# Patient Record
Sex: Male | Born: 2000 | Race: Black or African American | Marital: Single | Smoking: Never smoker
Health system: Southern US, Community
[De-identification: ages and names within clinical notes are randomized; demographics above are authoritative.]

## PROBLEM LIST (undated history)

## (undated) DIAGNOSIS — J45909 Unspecified asthma, uncomplicated: Secondary | ICD-10-CM

## (undated) DIAGNOSIS — F0781 Postconcussional syndrome: Secondary | ICD-10-CM

## (undated) DIAGNOSIS — F419 Anxiety disorder, unspecified: Secondary | ICD-10-CM

## (undated) DIAGNOSIS — R9431 Abnormal electrocardiogram [ECG] [EKG]: Secondary | ICD-10-CM

## (undated) DIAGNOSIS — J309 Allergic rhinitis, unspecified: Secondary | ICD-10-CM

## (undated) HISTORY — PX: NO PAST SURGERIES: SHX2092

## (undated) HISTORY — DX: Postconcussional syndrome: F07.81

## (undated) HISTORY — DX: Abnormal electrocardiogram (ECG) (EKG): R94.31

## (undated) HISTORY — DX: Allergic rhinitis, unspecified: J30.9

## (undated) HISTORY — DX: Unspecified asthma, uncomplicated: J45.909

---

## 2019-05-25 ENCOUNTER — Ambulatory Visit: Payer: PRIVATE HEALTH INSURANCE | Attending: Internal Medicine

## 2019-05-25 DIAGNOSIS — Z20828 Contact with and (suspected) exposure to other viral communicable diseases: Secondary | ICD-10-CM | POA: Insufficient documentation

## 2019-05-25 DIAGNOSIS — Z20822 Contact with and (suspected) exposure to covid-19: Secondary | ICD-10-CM

## 2019-05-27 LAB — NOVEL CORONAVIRUS, NAA: SARS-CoV-2, NAA: NOT DETECTED

## 2019-06-01 ENCOUNTER — Ambulatory Visit: Payer: PRIVATE HEALTH INSURANCE | Attending: Internal Medicine

## 2019-06-01 DIAGNOSIS — Z20822 Contact with and (suspected) exposure to covid-19: Secondary | ICD-10-CM | POA: Insufficient documentation

## 2019-06-02 LAB — NOVEL CORONAVIRUS, NAA: SARS-CoV-2, NAA: NOT DETECTED

## 2019-06-10 ENCOUNTER — Ambulatory Visit: Payer: PRIVATE HEALTH INSURANCE | Attending: Internal Medicine

## 2019-06-10 DIAGNOSIS — Z20822 Contact with and (suspected) exposure to covid-19: Secondary | ICD-10-CM | POA: Insufficient documentation

## 2019-06-12 LAB — NOVEL CORONAVIRUS, NAA: SARS-CoV-2, NAA: NOT DETECTED

## 2019-08-03 ENCOUNTER — Ambulatory Visit: Payer: PRIVATE HEALTH INSURANCE | Attending: Internal Medicine

## 2019-08-03 DIAGNOSIS — Z20822 Contact with and (suspected) exposure to covid-19: Secondary | ICD-10-CM | POA: Insufficient documentation

## 2019-08-04 LAB — NOVEL CORONAVIRUS, NAA: SARS-CoV-2, NAA: NOT DETECTED

## 2019-11-27 ENCOUNTER — Emergency Department (HOSPITAL_COMMUNITY): Payer: PRIVATE HEALTH INSURANCE

## 2019-11-27 ENCOUNTER — Encounter (HOSPITAL_COMMUNITY): Payer: Self-pay

## 2019-11-27 ENCOUNTER — Emergency Department (HOSPITAL_COMMUNITY)
Admission: EM | Admit: 2019-11-27 | Discharge: 2019-11-27 | Disposition: A | Discharge status: Home / Self Care | Payer: PRIVATE HEALTH INSURANCE | Attending: Emergency Medicine | Admitting: Emergency Medicine

## 2019-11-27 ENCOUNTER — Other Ambulatory Visit: Payer: Self-pay

## 2019-11-27 DIAGNOSIS — Y998 Other external cause status: Secondary | ICD-10-CM | POA: Insufficient documentation

## 2019-11-27 DIAGNOSIS — Y92310 Basketball court as the place of occurrence of the external cause: Secondary | ICD-10-CM | POA: Insufficient documentation

## 2019-11-27 DIAGNOSIS — Y9364 Activity, baseball: Secondary | ICD-10-CM | POA: Insufficient documentation

## 2019-11-27 DIAGNOSIS — S0990XA Unspecified injury of head, initial encounter: Secondary | ICD-10-CM | POA: Diagnosis present

## 2019-11-27 DIAGNOSIS — Y30XXXA Falling, jumping or pushed from a high place, undetermined intent, initial encounter: Secondary | ICD-10-CM | POA: Insufficient documentation

## 2019-11-27 DIAGNOSIS — S060X0A Concussion without loss of consciousness, initial encounter: Secondary | ICD-10-CM | POA: Insufficient documentation

## 2019-11-27 NOTE — Discharge Instructions (Addendum)
Your symptoms are unclear, but could be a concussion from the head injury in February 2021.  The CAT scan today is normal.  Call the doctor listed, for appointment to be seen for further evaluation and treatment of a possible concussion.

## 2019-11-27 NOTE — ED Provider Notes (Signed)
[facility]-EMERGENCY DEPT Provider Note   CSN: [id number] Arrival date & time: 11/27/19  2041     History Chief Complaint  Patient presents with  . Head Injury    Dave Munoz is a 19 y.o. male.  HPI He presents for evaluation of intermittent sensation of lightheadedness with dizziness, inattention to detail, and difficulty completing his job duties.  Onset of symptoms after a head injury while playing basketball in February 2021.  He had to stay out of basketball playing for 5 weeks, because of the symptoms but they improved somewhat so he returned to basketball.  He denies blurred vision, double vision, altered taste, sensation or smell.  He does not have neck or back pain.  He is not have ongoing headaches.  He has not had this problem before.  There are no other known modifying factors.  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      History reviewed. No pertinent past medical history.  There are no problems to display for this patient.       History reviewed. No pertinent family history.  Social History   Tobacco Use  . Smoking status: Not on file  Substance Use Topics  . Alcohol use: Not on file  . Drug use: Not on file    Home Medications Prior to Admission medications   Medication Sig Start Date End Date Taking? Authorizing Provider  albuterol (VENTOLIN HFA) 108 (90 Base) MCG/ACT inhaler albuterol sulfate HFA 90 mcg/actuation aerosol inhaler  INL 2 PFS PO Q 6 H PRN    [provider]    Allergies    Ibuprofen  Review of Systems   Review of Systems  All other systems reviewed and are negative.   Physical Exam Updated Vital Signs BP 128/74 (BP Location: Left Arm)   Pulse 75   Temp 98.4 F (36.9 C) (Oral)   Resp 16   Ht 6\' 2"  (1.88 m)   Wt 78 kg   SpO2 99%   BMI 22.08 kg/m   Physical Exam Vitals and nursing note reviewed.  Constitutional:      General:  He is not in acute distress.    Appearance: He is well-developed. He is not ill-appearing, toxic-appearing or diaphoretic.  HENT:     Head: Normocephalic and atraumatic.     Right Ear: External ear normal.     Left Ear: External ear normal.  Eyes:     Conjunctiva/sclera: Conjunctivae normal.     Pupils: Pupils are equal, round, and reactive to light.  Neck:     Trachea: Phonation normal.  Cardiovascular:     Rate and Rhythm: Normal rate and regular rhythm.     Heart sounds: Normal heart sounds.  Pulmonary:     Effort: Pulmonary effort is normal.     Breath sounds: Normal breath sounds.  Abdominal:     General: There is no distension.  Musculoskeletal:        General: Normal range of motion.     Cervical back: Normal range of motion and neck supple.     Comments: Normal gait  Skin:    General: Skin is warm and dry.  Neurological:     Mental Status: He is alert and oriented to person, place, and time.     Cranial Nerves: No cranial nerve deficit.     Sensory: No sensory deficit.     Motor: No abnormal muscle  tone.     Coordination: Coordination normal.     Comments: No dysarthria, aphasia or nystagmus, ataxia, pronator drift, abnormal Romberg.  Psychiatric:        Mood and Affect: Mood normal.        Behavior: Behavior normal.        Thought Content: Thought content normal.        Judgment: Judgment normal.     ED Results / Procedures / Treatments   Labs (all labs ordered are listed, but only abnormal results are displayed) Labs Reviewed - No data to display  EKG None  Radiology CT Head Wo Contrast  Result Date: 11/27/2019 CLINICAL DATA:  Dizziness EXAM: CT HEAD WITHOUT CONTRAST TECHNIQUE: Contiguous axial images were obtained from the base of the skull through the vertex without intravenous contrast. COMPARISON:  None. FINDINGS: Brain: No evidence of acute infarction, hemorrhage, hydrocephalus, extra-axial collection or mass lesion/mass effect. Vascular: No hyperdense  vessel or unexpected calcification. Skull: Normal. Negative for fracture or focal lesion. Sinuses/Orbits: No acute finding. Other: None IMPRESSION: Negative non contrasted CT appearance of the brain Electronically Signed   By: Jasmine Pang M.D.   On: 11/27/2019 23:04    Procedures Procedures (including critical care time)  Medications Ordered in ED Medications - No data to display  ED Course  I have reviewed the triage vital signs and the nursing notes.  Pertinent labs & imaging results that were available during my care of the patient were reviewed by me and considered in my medical decision making (see chart for details).  Clinical Course as of Nov 27 2319  Fri Nov 27, 2019  2321 Per radiologist, no acute abnormalities.  CT Head Wo Contrast [EW]    Clinical Course User Index [EW] Mancel Bale, MD   MDM Rules/Calculators/A&P                           Patient Vitals for the past 24 hrs:  BP Temp Temp src Pulse Resp SpO2 Height Weight  11/27/19 2123 128/74 -- -- 75 16 99 % -- --  11/27/19 2054 132/76 98.4 F (36.9 C) Oral 68 16 100 % 6\' 2"  (1.88 m) 78 kg    At the time of discharge- reevaluation with update and discussion. After initial assessment and treatment, an updated evaluation reveals he is comfortable and has no current complaints. Findings discussed and questions answered.   Medical Decision Making:  This patient is presenting for evaluation of vague symptoms since head injury several months ago, which does require a range of treatment options, and is a complaint that involves a moderate risk of morbidity and mortality. The differential diagnoses include anxiety, concussion, malaise. I decided to review old records, and in summary healthy young male with ongoing symptoms for many months after a fall head injury during a basketball game..  I did not require additional historical information from anyone.   Radiologic Tests Ordered, included CT head.   I independently Visualized: Radiographic images, which show normal findings    Critical Interventions-clinical evaluation, CT imaging, observation reassessment  After These Interventions, the Patient was reevaluated and was found patient may have mild concussion symptoms, bu there is no acute abnormality found on clinical or imaging evaluations  CRITICAL CARE-no Performed by: Mancel Bale  Nursing Notes Reviewed/ Care Coordinated Applicable Imaging Reviewed Interpretation of Laboratory Data incorporated into ED treatment  The patient appears reasonably screened and/or stabilized for discharge and I  doubt any other medical condition or other [facility] requiring further screening, evaluation, or treatment in the ED at this time prior to discharge.  Plan: Home Medications-OTC analgesia of choice if needed; Home Treatments-rest, fluids; return here if the recommended treatment, does not improve the symptoms; Recommended follow up-follow-up with sports medicine concussion specialist     Final Clinical Impression(s) / ED Diagnoses Final diagnoses:  Concussion without loss of consciousness, initial encounter    Rx / DC Orders ED Discharge Orders    None       Mancel Bale, MD 11/28/19 1729

## 2019-11-27 NOTE — ED Notes (Signed)
Patient ambulatory to CT

## 2019-11-27 NOTE — ED Triage Notes (Signed)
Pt states that he was dx'd with a concussion in February. States that he has not been the same since. Reports increased anxiety, intermittent dizziness and "fogginess." Denies nausea or pain at this time. Would like some head imaging. A&Ox4.

## 2019-12-01 ENCOUNTER — Telehealth: Payer: Self-pay | Admitting: Family Medicine

## 2019-12-01 NOTE — Telephone Encounter (Signed)
Called and scheduled pt for 12/02/19 at 3:45 pm.

## 2019-12-01 NOTE — Telephone Encounter (Signed)
Patient called requesting to schedule a Concussion visit.  He was seen in the ED on 11/27/2019: He presents for evaluation of intermittent sensation of lightheadedness with dizziness, inattention to detail, and difficulty completing his job duties.  Onset of symptoms after a head injury while playing basketball in February 2021.  He had to stay out of basketball playing for 5 weeks, because of the symptoms but they improved somewhat so he returned to basketball.  He denies blurred vision, double vision, altered taste, sensation or smell.  He does not have neck or back pain.  He is not have ongoing headaches.  He has not had this problem before.  There are no other known modifying factors.  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    Call back# [phone]

## 2019-12-02 ENCOUNTER — Ambulatory Visit (INDEPENDENT_AMBULATORY_CARE_PROVIDER_SITE_OTHER): Payer: PRIVATE HEALTH INSURANCE | Admitting: Family Medicine

## 2019-12-02 ENCOUNTER — Other Ambulatory Visit: Payer: Self-pay

## 2019-12-02 VITALS — BP 120/72 | HR 87 | Ht 74.0 in | Wt 169.2 lb

## 2019-12-02 DIAGNOSIS — F411 Generalized anxiety disorder: Secondary | ICD-10-CM

## 2019-12-02 DIAGNOSIS — F0781 Postconcussional syndrome: Secondary | ICD-10-CM | POA: Diagnosis not present

## 2019-12-02 DIAGNOSIS — J301 Allergic rhinitis due to pollen: Secondary | ICD-10-CM | POA: Insufficient documentation

## 2019-12-02 DIAGNOSIS — J45909 Unspecified asthma, uncomplicated: Secondary | ICD-10-CM | POA: Insufficient documentation

## 2019-12-02 MED ORDER — FLUOXETINE HCL 10 MG PO CAPS: ORAL_CAPSULE | ORAL | 0 refills | Status: DC

## 2019-12-02 NOTE — Progress Notes (Signed)
Subjective:    Chief Complaint: Dave Munoz, LAT, ATC, am serving as scribe for Dr. Clementeen Graham.  Dave Munoz,  is a 19 y.o. male who presents for evaluation of a head injury he sustained while playing basketball in Feb. 2021.  He had to remain out of basketball for 5 weeks after his initial injury but was able to return to full activity.  More recently, he recalls "knocking heads" w/ another player while playing pick-up basketball in June and now has c/o lightheadedness, dizziness, inattention to detail and difficulty completing his job duties.  No personal or family history of anxiety or depression or ADHD.  Injury date : Feb 2021 and re-injury in June 2021 Visit #: 1   History of Present Illness:    Concussion Self-Reported Symptom Score Symptoms rated on a scale 1-6, in last 24 hours   Headache: 3    Nausea: 2  Dizziness: 2  Vomiting: 0  Balance Difficulty: 2   Trouble Falling Asleep: 2   Fatigue: 3  Sleep Less Than Usual: 3  Daytime Drowsiness: 2  Sleep More Than Usual: 0  Photophobia: 2  Phonophobia: 2  Irritability: 2  Sadness: 1  Numbness or Tingling: 0  Nervousness: 4  Feeling More Emotional: 2  Feeling Mentally Foggy: 2  Feeling Slowed Down: 2  Memory Problems: 1  Difficulty Concentrating: 3  Visual Problems: 2   Total # of Symptoms: 19/22 Total Symptom Score: 52/132 Previous Symptom Score: NA   Neck Pain: Yes/No  Tinnitus: Yes/No  Review of Systems: No fevers or chills   Review of History: No prior history of concussion.  No personal history anxiety or depression or ADHD.  Personal history of asthma  Objective:    Physical Examination Vitals:   12/02/19 1530  BP: 120/72  Pulse: 87  SpO2: 97%   MSK: Normal cervical motion. Neuro: Normal coordination balance and gait Psych: Alert and oriented normal speech thought process and affect.   Assessment and Plan   19 y.o. male with postconcussion syndrome.  Concussion originally was in  February.  Symptoms today are predominantly mood related.  Fundamentally his symptoms act like anxiety.  We will treat as such.  Discussed options.  Plan to refer for counseling and will start SSRI.  We will start Prozac at 10 mg titrating to 20 mg in 1 week and follow-up in 2 weeks.  Patient agrees with plan.  Recheck back or contact me sooner if needed.  We will send copy of today's note to primary care provider.      Action/Discussion: Reviewed diagnosis, management options, expected outcomes, and the reasons for scheduled and emergent follow-up. Questions were adequately answered. Patient expressed verbal understanding and agreement with the following plan.     Patient Education:  Reviewed with patient the risks (i.e, a repeat concussion, post-concussion syndrome, second-impact syndrome) of returning to play prior to complete resolution, and thoroughly reviewed the signs and symptoms of concussion.Reviewed need for complete resolution of all symptoms, with rest AND exertion, prior to return to play.  Reviewed red flags for urgent medical evaluation: worsening symptoms, nausea/vomiting, intractable headache, musculoskeletal changes, focal neurological deficits.  [facility]'s Concussion Care Plan, which clearly outlines the plans stated above, was given to patient.   In addition to the time spent performing tests, I spent 30 min   Reviewed with patient the risks (i.e, a repeat concussion, post-concussion syndrome, second-impact syndrome) of returning to play prior to complete resolution, and thoroughly reviewed the  signs and symptoms of      concussion. Reviewedf need for complete resolution of all symptoms, with rest AND exertion, prior to return to play.  Reviewed red flags for urgent medical evaluation: worsening symptoms, nausea/vomiting, intractable headache, musculoskeletal changes, focal neurological deficits.  [facility] Concussion Care Plan, which  clearly outlines the plans stated above, was given to patient   After Visit Summary printed out and provided to patient as appropriate.  The above documentation has been reviewed and is accurate and complete Clementeen Graham

## 2019-12-02 NOTE — Patient Instructions (Addendum)
Thank you for coming in today. Take prozac 1 pill daily for 1 week then increase to 2 pills daily.  Recheck in about 2 weeks.   I am also referring to therapy. You should hear soon about that.  Let me know if you have a problem.   I think the concussion is causing the anxiety.    Fluoxetine capsules or tablets (Depression/Mood Disorders) What is this medicine? FLUOXETINE (floo OX e teen) belongs to a class of drugs known as selective serotonin reuptake inhibitors (SSRIs). It helps to treat mood problems such as depression, obsessive compulsive disorder, and panic attacks. It can also treat certain eating disorders. This medicine may be used for other purposes; ask your health care provider or pharmacist if you have questions. COMMON BRAND NAME(S): Prozac What should I tell my health care provider before I take this medicine? They need to know if you have any of these conditions:  bipolar disorder or a family history of bipolar disorder  bleeding disorders  glaucoma  heart disease  liver disease  low levels of sodium in the blood  seizures  suicidal thoughts, plans, or attempt; a previous suicide attempt by you or a family member  take MAOIs like Carbex, Eldepryl, Marplan, Nardil, and Parnate  take medicines that treat or prevent blood clots  thyroid disease  an unusual or allergic reaction to fluoxetine, other medicines, foods, dyes, or preservatives  pregnant or trying to get pregnant  breast-feeding How should I use this medicine? Take this medicine by mouth with a glass of water. Follow the directions on the prescription label. You can take this medicine with or without food. Take your medicine at regular intervals. Do not take it more often than directed. Do not stop taking this medicine suddenly except upon the advice of your doctor. Stopping this medicine too quickly may cause serious side effects or your condition may worsen. A special MedGuide will be given to  you by the pharmacist with each prescription and refill. Be sure to read this information carefully each time. Talk to your pediatrician regarding the use of this medicine in children. While this drug may be prescribed for children as young as 7 years for selected conditions, precautions do apply. Overdosage: If you think you have taken too much of this medicine contact a poison control center or emergency room at once. NOTE: This medicine is only for you. Do not share this medicine with others. What if I miss a dose? If you miss a dose, skip the missed dose and go back to your regular dosing schedule. Do not take double or extra doses. What may interact with this medicine? Do not take this medicine with any of the following medications:  other medicines containing fluoxetine, like Sarafem or Symbyax  cisapride  dronedarone  linezolid  MAOIs like Carbex, Eldepryl, Marplan, Nardil, and Parnate  methylene blue (injected into a vein)  pimozide  thioridazine This medicine may also interact with the following medications:  alcohol  amphetamines  aspirin and aspirin-like medicines  carbamazepine  certain medicines for depression, anxiety, or psychotic disturbances  certain medicines for migraine headaches like almotriptan, eletriptan, frovatriptan, naratriptan, rizatriptan, sumatriptan, zolmitriptan  digoxin  diuretics  fentanyl  flecainide  furazolidone  isoniazid  lithium  medicines for sleep  medicines that treat or prevent blood clots like warfarin, enoxaparin, and dalteparin  NSAIDs, medicines for pain and inflammation, like ibuprofen or naproxen  other medicines that prolong the QT interval (an abnormal heart rhythm)  phenytoin  procarbazine  propafenone  rasagiline  ritonavir  supplements like St. John's wort, kava kava, valerian  tramadol  tryptophan  vinblastine This list may not describe all possible interactions. Give your health care  provider a list of all the medicines, herbs, non-prescription drugs, or dietary supplements you use. Also tell them if you smoke, drink alcohol, or use illegal drugs. Some items may interact with your medicine. What should I watch for while using this medicine? Tell your doctor if your symptoms do not get better or if they get worse. Visit your doctor or health care professional for regular checks on your progress. Because it may take several weeks to see the full effects of this medicine, it is important to continue your treatment as prescribed by your doctor. Patients and their families should watch out for new or worsening thoughts of suicide or depression. Also watch out for sudden changes in feelings such as feeling anxious, agitated, panicky, irritable, hostile, aggressive, impulsive, severely restless, overly excited and hyperactive, or not being able to sleep. If this happens, especially at the beginning of treatment or after a change in dose, call your health care professional. Bonita Quin may get drowsy or dizzy. Do not drive, use machinery, or do anything that needs mental alertness until you know how this medicine affects you. Do not stand or sit up quickly, especially if you are an older patient. This reduces the risk of dizzy or fainting spells. Alcohol may interfere with the effect of this medicine. Avoid alcoholic drinks. Your mouth may get dry. Chewing sugarless gum or sucking hard candy, and drinking plenty of water may help. Contact your doctor if the problem does not go away or is severe. This medicine may affect blood sugar levels. If you have diabetes, check with your doctor or health care professional before you change your diet or the dose of your diabetic medicine. What side effects may I notice from receiving this medicine? Side effects that you should report to your doctor or health care professional as soon as possible:  allergic reactions like skin rash, itching or hives, swelling of  the face, lips, or tongue  anxious  black, tarry stools  breathing problems  changes in vision  confusion  elevated mood, decreased need for sleep, racing thoughts, impulsive behavior  eye pain  fast, irregular heartbeat  feeling faint or lightheaded, falls  feeling agitated, angry, or irritable  hallucination, loss of contact with reality  loss of balance or coordination  loss of memory  painful or prolonged erections  restlessness, pacing, inability to keep still  seizures  stiff muscles  suicidal thoughts or other mood changes  trouble sleeping  unusual bleeding or bruising  unusually weak or tired  vomiting Side effects that usually do not require medical attention (report to your doctor or health care professional if they continue or are bothersome):  change in appetite or weight  change in sex drive or performance  diarrhea  dry mouth  headache  increased sweating  nausea  tremors This list may not describe all possible side effects. Call your doctor for medical advice about side effects. You may report side effects to FDA at 1-800-FDA-1088. Where should I keep my medicine? Keep out of the reach of children. Store at room temperature between 15 and 30 degrees C (59 and 86 degrees F). Throw away any unused medicine after the expiration date. NOTE: This sheet is a summary. It may not cover all possible information. If you have questions about this  medicine, talk to your doctor, pharmacist, or health care provider.  2020 [employer]vier/Gold Standard (2018-01-02 11:56:53)

## 2019-12-12 ENCOUNTER — Emergency Department (HOSPITAL_COMMUNITY)
Admission: EM | Admit: 2019-12-12 | Discharge: 2019-12-12 | Disposition: A | Discharge status: Home / Self Care | Payer: PRIVATE HEALTH INSURANCE | Attending: Emergency Medicine | Admitting: Emergency Medicine

## 2019-12-12 ENCOUNTER — Encounter (HOSPITAL_COMMUNITY): Payer: Self-pay

## 2019-12-12 ENCOUNTER — Other Ambulatory Visit: Payer: Self-pay

## 2019-12-12 DIAGNOSIS — R002 Palpitations: Secondary | ICD-10-CM | POA: Insufficient documentation

## 2019-12-12 DIAGNOSIS — F419 Anxiety disorder, unspecified: Secondary | ICD-10-CM | POA: Diagnosis not present

## 2019-12-12 NOTE — ED Provider Notes (Addendum)
TIME SEEN: 2:36 AM  CHIEF COMPLAINT: Anxiety, palpitations  HPI: Patient is a 19 year old male who presents to the emergency department with what he suspects was a panic attack.  He states that he was at rest tonight and suddenly felt his heart racing.  States he started feeling very anxious.  No chest pain or shortness of breath.  States he started to take deep breaths and symptoms slowly have subsequently resolved.  He has had similar episodes before and was told by her primary care provider that this was anxiety and was recently started on fluoxetine.  He denies SI or HI.  He denies any fevers, cough, vomiting, diarrhea, bloody stools or melena.  No history of PE, DVT, exogenous estrogen use, recent fractures, surgery, trauma, hospitalization or prolonged travel. No lower extremity swelling or pain. No calf tenderness.  Denies family history of CAD or sudden cardiac death.  States he never developed symptoms with exercise.  He reports he thinks the symptoms all started after a concussion that he had several months ago.  He was seen in the emergency department on July 2 for the same and had a negative head CT.  ROS: See HPI Constitutional: no fever  Eyes: no drainage  ENT: no runny nose   Cardiovascular:  no chest pain  Resp: no SOB  GI: no vomiting GU: no dysuria Integumentary: no rash  Allergy: no hives  Musculoskeletal: no leg swelling  Neurological: no slurred speech ROS otherwise negative  PAST MEDICAL HISTORY/PAST SURGICAL HISTORY:  History reviewed. No pertinent past medical history.  MEDICATIONS:  Prior to Admission medications   Medication Sig Start Date End Date Taking? Authorizing Provider  acetaminophen (TYLENOL) 500 MG tablet Take 1,000 mg by mouth every 6 (six) hours as needed for mild pain.    [provider]  albuterol (VENTOLIN HFA) 108 (90 Base) MCG/ACT inhaler Inhale into the lungs every 6 (six) hours as needed.     [provider]  FLUoxetine  (PROZAC) 10 MG capsule Take 1 capsule (10 mg total) by mouth daily for 7 days, THEN 2 capsules (20 mg total) daily for 28 days. 12/02/19 01/06/20  Rodolph Bong, MD  Multiple Vitamin (MULTIVITAMIN WITH MINERALS) TABS tablet Take 1 tablet by mouth daily.    [provider]    ALLERGIES:  Allergies  Allergen Reactions  . Ibuprofen Swelling    Swelling below eyes and irritation    SOCIAL HISTORY:  Social History   Tobacco Use  . Smoking status: Not on file  Substance Use Topics  . Alcohol use: Not on file    FAMILY HISTORY: No family history on file.  EXAM: BP (!) 136/98 (BP Location: Left Arm)   Pulse 75   Temp 98.3 F (36.8 C) (Oral)   Resp 20   Ht 6\' 2"  (1.88 m)   Wt 77.1 kg   SpO2 100%   BMI 21.83 kg/m  CONSTITUTIONAL: Alert and oriented and responds appropriately to questions. Well-appearing; well-nourished HEAD: Normocephalic EYES: Conjunctivae clear, pupils appear equal, EOM appear intact ENT: normal nose; moist mucous membranes NECK: Supple, normal ROM CARD: RRR; S1 and S2 appreciated; no murmurs, no clicks, no rubs, no gallops RESP: Normal chest excursion without splinting or tachypnea; breath sounds clear and equal bilaterally; no wheezes, no rhonchi, no rales, no hypoxia or respiratory distress, speaking full sentences ABD/GI: Normal bowel sounds; non-distended; soft, non-tender, no rebound, no guarding, no peritoneal signs, no hepatosplenomegaly BACK:  The back appears normal EXT: Normal ROM  in all joints; no deformity noted, no edema; no cyanosis, no calf tenderness Or Swelling SKIN: Normal color for age and race; warm; no rash on exposed skin NEURO: Moves all extremities equally PSYCH: The patient's mood and manner are appropriate.  No SI or HI.  MEDICAL DECISION MAKING: Patient here with palpitations.  Does seem very likely that this is anxiety.  States he is able to take deep breaths and relaxing the symptoms resolved.  No SI or HI.  EKG arrhythmia,  interval abnormality or ischemic change.  No signs of WPW or Brugada.  Given symptoms improved and he is now asymptomatic and otherwise healthy with reassuring history and exam, I do not feel he needs further emergent work-up.  Recommended he continue to follow-up with his primary care provider.  We did discuss return precautions.  He verbalized understanding.  At this time, I do not feel there is any life-threatening condition present. I have reviewed, interpreted and discussed all results (EKG, imaging, lab, urine as appropriate) and exam findings with patient/family. I have reviewed nursing notes and appropriate previous records.  I feel the patient is safe to be discharged home without further emergent workup and can continue workup as an outpatient as needed. Discussed usual and customary return precautions. Patient/family verbalize understanding and are comfortable with this plan.  Outpatient follow-up has been provided as needed. All questions have been answered.      EKG Interpretation  Date/Time:  Saturday December 12 2019 02:04:56 EDT Ventricular Rate:  57 PR Interval:    QRS Duration: 98 QT Interval:  412 QTC Calculation: 402 R Axis:   83 Text Interpretation: Sinus rhythm Probable left ventricular hypertrophy Benign early repolarization 12 Lead; Mason-Likar No old tracing to compare Confirmed by Yvonne Petite, Dave Munoz [phone]) on 12/12/2019 2:12:37 AM         Dave Munoz was evaluated in Emergency Department on 12/12/2019 for the symptoms described in the history of present illness. He was evaluated in the context of the global COVID-19 pandemic, which necessitated consideration that the patient might be at risk for infection with the SARS-CoV-2 virus that causes COVID-19. Institutional protocols and algorithms that pertain to the evaluation of patients at risk for COVID-19 are in a state of rapid change based on information released by regulatory bodies including the CDC and federal and state  organizations. These policies and algorithms were followed during the patient's care in the ED.      Marlene Pfluger, Layla Maw, DO 12/12/19 0736    [name], Layla Maw, DO 12/12/19 [phone]

## 2019-12-12 NOTE — ED Triage Notes (Signed)
Pt sts he thinks he was having a panic attack around 9pm. Pt sts laying in bed and feeling heart race faster than it should.

## 2019-12-12 NOTE — Discharge Instructions (Addendum)
Please follow-up with [facility].  Please continue your fluoxetine as prescribed.

## 2019-12-14 ENCOUNTER — Emergency Department (HOSPITAL_COMMUNITY): Payer: PRIVATE HEALTH INSURANCE

## 2019-12-14 ENCOUNTER — Other Ambulatory Visit: Payer: Self-pay

## 2019-12-14 ENCOUNTER — Emergency Department (HOSPITAL_COMMUNITY)
Admission: EM | Admit: 2019-12-14 | Discharge: 2019-12-14 | Disposition: A | Discharge status: Home / Self Care | Payer: PRIVATE HEALTH INSURANCE | Attending: Emergency Medicine | Admitting: Emergency Medicine

## 2019-12-14 ENCOUNTER — Encounter (HOSPITAL_COMMUNITY): Payer: Self-pay

## 2019-12-14 DIAGNOSIS — J45909 Unspecified asthma, uncomplicated: Secondary | ICD-10-CM | POA: Diagnosis not present

## 2019-12-14 DIAGNOSIS — R002 Palpitations: Secondary | ICD-10-CM | POA: Diagnosis not present

## 2019-12-14 HISTORY — DX: Anxiety disorder, unspecified: F41.9

## 2019-12-14 LAB — CBC WITH DIFFERENTIAL/PLATELET
Abs Immature Granulocytes: 0.01 10*3/uL (ref 0.00–0.07)
Basophils Absolute: 0 10*3/uL (ref 0.0–0.1)
Basophils Relative: 1 %
Eosinophils Absolute: 0.2 10*3/uL (ref 0.0–0.5)
Eosinophils Relative: 3 %
HCT: 47.5 % (ref 39.0–52.0)
Hemoglobin: 15.9 g/dL (ref 13.0–17.0)
Immature Granulocytes: 0 %
Lymphocytes Relative: 29 %
Lymphs Abs: 2.3 10*3/uL (ref 0.7–4.0)
MCH: 28.6 pg (ref 26.0–34.0)
MCHC: 33.5 g/dL (ref 30.0–36.0)
MCV: 85.4 fL (ref 80.0–100.0)
Monocytes Absolute: 0.7 10*3/uL (ref 0.1–1.0)
Monocytes Relative: 8 %
Neutro Abs: 4.9 10*3/uL (ref 1.7–7.7)
Neutrophils Relative %: 59 %
Platelets: 322 10*3/uL (ref 150–400)
RBC: 5.56 MIL/uL (ref 4.22–5.81)
RDW: 12.2 % (ref 11.5–15.5)
WBC: 8.1 10*3/uL (ref 4.0–10.5)
nRBC: 0 % (ref 0.0–0.2)

## 2019-12-14 LAB — BASIC METABOLIC PANEL
Anion gap: 8 (ref 5–15)
BUN: 13 mg/dL (ref 6–20)
CO2: 28 mmol/L (ref 22–32)
Calcium: 9.1 mg/dL (ref 8.9–10.3)
Chloride: 103 mmol/L (ref 98–111)
Creatinine, Ser: 0.91 mg/dL (ref 0.61–1.24)
GFR calc Af Amer: >60 mL/min (ref >60)
GFR calc non Af Amer: >60 mL/min (ref >60)
Glucose, Bld: 96 mg/dL (ref 70–99)
Potassium: 3.7 mmol/L (ref 3.5–5.1)
Sodium: 139 mmol/L (ref 135–145)

## 2019-12-14 LAB — TSH: TSH: 1.112 u[IU]/mL (ref 0.350–4.500)

## 2019-12-14 LAB — MAGNESIUM: Magnesium: 2.3 mg/dL (ref 1.7–2.4)

## 2019-12-14 MED ORDER — HYDROXYZINE HCL 25 MG PO TABS: 25.0000 mg | ORAL_TABLET | Freq: Four times a day (QID) | ORAL | 0 refills | Status: AC

## 2019-12-14 NOTE — Discharge Instructions (Signed)
You may take the hydroxyzine as needed.  Follow-up with cardiology.  Return for new or worsening symptoms

## 2019-12-14 NOTE — ED Triage Notes (Signed)
Patient states that he was seen twice for palpitations. Patient had EKG's completed with each visit. Patient states he was also placed on Prozac. Patient states tht he still feels anxious when he lays down to sleep. Patient states he has questions about both and wants to know if his anxiety is causing the palpitations at night.

## 2019-12-14 NOTE — ED Provider Notes (Signed)
[facility]-EMERGENCY DEPT Provider Note   CSN: [id number] Arrival date & time: 12/14/19  1540     History Chief Complaint  Patient presents with  . Anxiety  . Abnormal ECG    Dave Munoz is a 19 y.o. male with past medical history significant for postconcussion syndrome who presents for evaluation of palpitations.  Patient states he has been seen multiple times.  Thought due to anxiety and started on Prozac.  Patient states he started taking a new or higher dose tomorrow.  Patient states he randomly throughout the day will get palpitations.  They will last a couple seconds to 1 minute.  No associated chest pain, shortness of breath.  Patient states he does not feel particularly anxious when this occurs.  They did start after he obtained a head injury in February.  Had negative CT imaging.  Was seen by the postconcussive clinic without his symptoms possibly due to anxiety was started on Prozac.  No prior history or family history of sudden cardiac death.  No family history of arrhythmias.  He is active in basketball.  No exogenous hormone use.  Does drink approximately 2 caffeinated beverages daily.  Denies additional aggravating or relieving factors.  No recent surgery, immobilization.  No history of PE or DVT. No unilateral leg swelling, redness or warmth.  History obtained from patient and past medical records.  No interpreter is used.  HPI     Past Medical History:  Diagnosis Date  . Anxiety     Patient Active Problem List   Diagnosis Date Noted  . Allergic rhinitis due to pollen 12/02/2019  . Asthma 12/02/2019  . Postconcussion syndrome 12/02/2019    History reviewed. No pertinent surgical history.     Family History  Problem Relation Age of Onset  . Anxiety disorder Mother     Social History   Tobacco Use  . Smoking status: Never Smoker  . Smokeless tobacco: Never Used  Vaping Use  . Vaping Use: Never used  Substance Use Topics  .  Alcohol use: Yes  . Drug use: Never    Home Medications Prior to Admission medications   Medication Sig Start Date End Date Taking? Authorizing Provider  acetaminophen (TYLENOL) 500 MG tablet Take 1,000 mg by mouth every 6 (six) hours as needed for mild pain.    [provider]  albuterol (VENTOLIN HFA) 108 (90 Base) MCG/ACT inhaler Inhale into the lungs every 6 (six) hours as needed.     [provider]  FLUoxetine (PROZAC) 10 MG capsule Take 1 capsule (10 mg total) by mouth daily for 7 days, THEN 2 capsules (20 mg total) daily for 28 days. 12/02/19 01/06/20  Rodolph Bong, MD  hydrOXYzine (ATARAX/VISTARIL) 25 MG tablet Take 1 tablet (25 mg total) by mouth every 6 (six) hours. 12/14/19   Rizwan Kuyper A, PA-C  Multiple Vitamin (MULTIVITAMIN WITH MINERALS) TABS tablet Take 1 tablet by mouth daily.    [provider]    Allergies    Ibuprofen  Review of Systems   Review of Systems  Constitutional: Negative.   HENT: Negative.   Respiratory: Negative.   Cardiovascular: Positive for palpitations. Negative for chest pain and leg swelling.  Gastrointestinal: Negative.   Genitourinary: Negative.   Musculoskeletal: Negative.   Skin: Negative.   Neurological: Negative.   All other systems reviewed and are negative.  Physical Exam Updated Vital Signs BP 130/74 (BP Location: Left Arm)   Pulse 83   Temp 99.4  F (37.4 C) (Oral)   Resp 20   Ht 6\' 2"  (1.88 m)   Wt 77 kg   SpO2 96%   BMI 21.80 kg/m   Physical Exam Vitals and nursing note reviewed.  Constitutional:      General: He is not in acute distress.    Appearance: He is well-developed. He is not ill-appearing, toxic-appearing or diaphoretic.  HENT:     Head: Normocephalic and atraumatic.     Nose: Nose normal.     Mouth/Throat:     Mouth: Mucous membranes are moist.  Eyes:     Pupils: Pupils are equal, round, and reactive to light.  Cardiovascular:     Rate and Rhythm: Normal rate and regular  rhythm.     Pulses: Normal pulses.     Heart sounds: Normal heart sounds.  Pulmonary:     Effort: Pulmonary effort is normal. No respiratory distress.     Breath sounds: Normal breath sounds.  Abdominal:     General: Bowel sounds are normal. There is no distension.     Palpations: Abdomen is soft.  Musculoskeletal:        General: Normal range of motion.     Cervical back: Normal range of motion and neck supple.     Comments: Moves all 4 extremities without difficulty.  Compartments soft.  No bony tenderness.  ' sign negative  Skin:    General: Skin is warm and dry.     Capillary Refill: Capillary refill takes less than 2 seconds.     Comments: No edema, erythema or warmth no fluctuance or induration  Neurological:     General: No focal deficit present.     Mental Status: He is alert and oriented to person, place, and time.  Psychiatric:     Comments: Denies SI, HI, AVH    ED Results / Procedures / Treatments   Labs (all labs ordered are listed, but only abnormal results are displayed) Labs Reviewed  CBC WITH DIFFERENTIAL/PLATELET  BASIC METABOLIC PANEL  TSH  MAGNESIUM  T4, FREE    EKG EKG Interpretation  Date/Time:  Monday December 14 2019 20:06:14 EDT Ventricular Rate:  70 PR Interval:    QRS Duration: 98 QT Interval:  361 QTC Calculation: 390 R Axis:   92 Text Interpretation: Sinus rhythm Lateral infarct, age indeterminate Anteroseptal infarct, old 39 Lead; Mason-Likar Confirmed by 14 (Cherlynn Perches) on 12/14/2019 8:21:33 PM   Radiology DG Chest Portable 1 View  Result Date: 12/14/2019 CLINICAL DATA:  Palpitations, anxiety EXAM: PORTABLE CHEST 1 VIEW COMPARISON:  None. FINDINGS: No consolidation, features of edema, pneumothorax, or effusion. Pulmonary vascularity is normally distributed. The cardiomediastinal contours are unremarkable. No acute osseous or soft tissue abnormality. Telemetry leads overlie the chest. IMPRESSION: No acute cardiopulmonary  abnormality. Electronically Signed   By: 12/16/2019 M.D.   On: 12/14/2019 20:52    Procedures Procedures (including critical care time)  Medications Ordered in ED Medications - No data to display  ED Course  I have reviewed the triage vital signs and the nursing notes.  Pertinent labs & imaging results that were available during my care of the patient were reviewed by me and considered in my medical decision making (see chart for details).  21 old male presents for evaluation of palpitations.  This is his 3rd visit for similar complaint.  He is afebrile, nonseptic, non-ill-appearing.  Symptoms began after sustaining concussion in February.  Was seen by the postconcussive clinic and thought symptoms were  likely related more to anxiety.  He was started on Prozac.  He is supposed to tomorrow.  He denies SI, HI, AVH.  He does drink 2 cups of caffeine daily.  No family or personal history of sudden cardiac death, arrhythmia.  No personal history of thyroid problems.  Patient states symptoms will start randomly he will get palpitations they will occur for seconds to minutes and self resolves.  He does not feel particularly anxious when this occurs.  No unilateral leg swelling, redness or warmth.  Compartments soft.  Clinically no evidence of DVT.  He is without tachycardia, tachypnea or hypoxia.  He is PERC negative, Wells criteria low risk.  No exogenous hormone use.  Throughout his previous exams he has not any labs obtained.  He did have head CT obtained previously which I personally viewed which not show any evidence of acute intracranial abnormality.  He has had multiple EKGs performed her rule repeat today.  Labs and imaging personally reviewed and interpreted: CBC without leukpcytosis, hemoglobin 15.9 BMP without electrolyte, renal abnormality Magnesium 2.3 TSH 1.112  T4 pending EKG No Brugada, Prolonged QTc, WPW, delta waves DG chest without cardiomegaly.  Patient reassessed. Discussed  findings of labs and imaging.  Symptoms can certainly be related to anxiety however patient states these occur multiple times a day and occur more frequently feel he will benefit from cardiology evaluation, possible Holter monitor.  He does not have a PCP locally.  Will refer outpatient to cardiology. No arrhythmia here in ED or tachycardia.   The patient has been appropriately medically screened and/or stabilized in the ED. I have low suspicion for any other emergent medical condition which would require further screening, evaluation or treatment in the ED or require inpatient management.  Patient is hemodynamically stable and in no acute distress.  Patient able to ambulate in department prior to ED.  Evaluation does not show acute pathology that would require ongoing or additional emergent interventions while in the emergency department or further inpatient treatment.  I have discussed the diagnosis with the patient and answered all questions.  Pain is been managed while in the emergency department and patient has no further complaints prior to discharge.  Patient is comfortable with plan discussed in room and is stable for discharge at this time.  I have discussed strict return precautions for returning to the emergency department.  Patient was encouraged to follow-up with PCP/specialist refer to at discharge.     MDM Rules/Calculators/A&P                          Final Clinical Impression(s) / ED Diagnoses Final diagnoses:  Palpitations    Rx / DC Orders ED Discharge Orders         Ordered    hydrOXYzine (ATARAX/VISTARIL) 25 MG tablet  Every 6 hours     Discontinue  Reprint     12/14/19 2209    Ambulatory referral to Cardiology     Discontinue  Reprint    Comments: Palpitations   12/14/19 2209           Divine Imber A, PA-C 12/14/19 2210    Sabino Donovan, MD 12/14/19 2240

## 2019-12-15 LAB — T4, FREE: Free T4: 1 ng/dL (ref 0.61–1.12)

## 2020-01-14 ENCOUNTER — Other Ambulatory Visit: Payer: Self-pay | Admitting: Family Medicine

## 2020-01-29 ENCOUNTER — Ambulatory Visit: Payer: PRIVATE HEALTH INSURANCE | Admitting: Internal Medicine

## 2020-02-08 ENCOUNTER — Ambulatory Visit: Payer: PRIVATE HEALTH INSURANCE | Admitting: Internal Medicine

## 2020-02-08 NOTE — Progress Notes (Deleted)
Cardiology Office Note:    Date:  02/08/2020   ID:  Dave Munoz, DOB 2001/04/22, MRN [id number]  PCP:  Josph Macho, FNP  [facility] Cardiologist:  No primary care provider on file.  [facility] Electrophysiologist:  None   Referring MD: Henderly, Britni A, PA-C   No chief complaint on file. ***  Seen for evaluation for palpitations with prior concussion at the behest of Dave Munoz, [place].  History of Present Illness:    Dave Munoz is a 19 y.o. male with a hx of post concussion syndrome,   Past Medical History:  Diagnosis Date   Abnormal EKG    Allergic rhinitis    Anxiety    Asthma    Postconcussion syndrome     Past Surgical History:  Procedure Laterality Date   NO PAST SURGERIES      Current Medications: No outpatient medications have been marked as taking for the 02/08/20 encounter (Appointment) with Christell Constant, MD.     Allergies:   Ibuprofen   Social History   Socioeconomic History   Marital status: Single    Spouse name: Not on file   Number of children: Not on file   Years of education: Not on file   Highest education level: Not on file  Occupational History   Not on file  Tobacco Use   Smoking status: Never Smoker   Smokeless tobacco: Never Used  Vaping Use   Vaping Use: Never used  Substance and Sexual Activity   Alcohol use: Yes   Drug use: Never   Sexual activity: Not on file  Other Topics Concern   Not on file  Social History Narrative   Not on file   Social Determinants of Health   Financial Resource Strain:    Difficulty of Paying Living Expenses: Not on file  Food Insecurity:    Worried About Running Out of Food in the Last Year: Not on file   Ran Out of Food in the Last Year: Not on file  Transportation Needs:    Lack of Transportation (Medical): Not on file   Lack of Transportation (Non-Medical): Not on file  Physical Activity:    Days of Exercise per Week: Not  on file   Minutes of Exercise per Session: Not on file  Stress:    Feeling of Stress : Not on file  Social Connections:    Frequency of Communication with Friends and Family: Not on file   Frequency of Social Gatherings with Friends and Family: Not on file   Attends Religious Services: Not on file   Active Member of Clubs or Organizations: Not on file   Attends Banker Meetings: Not on file   Marital Status: Not on file     Family History: The patient's ***family history includes Anxiety disorder in his mother.  ROS:   Please see the history of present illness.    *** All other systems reviewed and are negative.  EKGs/Labs/Other Studies Reviewed:    The following studies were reviewed today: ***  EKG:  EKG is *** ordered today.  The ekg ordered today demonstrates *** 11/27/19: sinus rhythm rate 70 LVH; possible anterior infarct pattern  Recent Labs: 12/14/2019: BUN 13; Creatinine, Ser 0.91; Hemoglobin 15.9; Magnesium 2.3; Platelets 322; Potassium 3.7; Sodium 139; TSH 1.112  Recent Lipid Panel No results found for: CHOL, TRIG, HDL, CHOLHDL, VLDL, LDLCALC, LDLDIRECT  Physical Exam:    VS:  There were no vitals taken for this  visit.    Wt Readings from Last 3 Encounters:  12/14/19 169 lb 12.1 oz (77 kg)  12/12/19 170 lb (77.1 kg)  12/02/19 169 lb 3.2 oz (76.7 kg)     GEN: *** Well nourished, well developed in no acute distress HEENT: Normal NECK: No JVD; No carotid bruits LYMPHATICS: No lymphadenopathy CARDIAC: ***RRR, no murmurs, rubs, gallops RESPIRATORY:  Clear to auscultation without rales, wheezing or rhonchi  ABDOMEN: Soft, non-tender, non-distended MUSCULOSKELETAL:  No edema; No deformity  SKIN: Warm and dry NEUROLOGIC:  Alert and oriented x 3 PSYCHIATRIC:  Normal affect   ASSESSMENT:    No diagnosis found. PLAN:    In order of problems listed above:  1. Palpitations - ZioPatch   Medication Adjustments/Labs and Tests  Ordered: Current medicines are reviewed at length with the patient today.  Concerns regarding medicines are outlined above.  No orders of the defined types were placed in this encounter.  No orders of the defined types were placed in this encounter.   There are no Patient Instructions on file for this visit.   Signed, Christell Constant, MD  02/08/2020 7:44 AM    [facility]

## 2020-04-20 ENCOUNTER — Other Ambulatory Visit: Payer: Self-pay | Admitting: Family Medicine

## 2020-06-24 ENCOUNTER — Emergency Department (HOSPITAL_COMMUNITY)
Admission: EM | Admit: 2020-06-24 | Discharge: 2020-06-24 | Disposition: A | Discharge status: Home / Self Care | Payer: PRIVATE HEALTH INSURANCE | Attending: Emergency Medicine | Admitting: Emergency Medicine

## 2020-06-24 ENCOUNTER — Encounter (HOSPITAL_COMMUNITY): Payer: Self-pay

## 2020-06-24 ENCOUNTER — Other Ambulatory Visit: Payer: Self-pay

## 2020-06-24 DIAGNOSIS — R519 Headache, unspecified: Secondary | ICD-10-CM | POA: Diagnosis not present

## 2020-06-24 DIAGNOSIS — J039 Acute tonsillitis, unspecified: Secondary | ICD-10-CM | POA: Diagnosis not present

## 2020-06-24 DIAGNOSIS — J029 Acute pharyngitis, unspecified: Secondary | ICD-10-CM | POA: Diagnosis present

## 2020-06-24 DIAGNOSIS — J45909 Unspecified asthma, uncomplicated: Secondary | ICD-10-CM | POA: Diagnosis not present

## 2020-06-24 MED ORDER — DEXAMETHASONE SODIUM PHOSPHATE 10 MG/ML IJ SOLN
10.0000 mg | Freq: Once | INTRAMUSCULAR | Status: AC
  Administered 2020-06-24: 10 mg via INTRAMUSCULAR
  Filled 2020-06-24: qty 1

## 2020-06-24 NOTE — ED Provider Notes (Signed)
[facility]-EMERGENCY DEPT Provider Note   CSN: [id number] Arrival date & time: 06/24/20  1622     History Chief Complaint  Patient presents with  . Sore Throat  . Otalgia  . Headache    Dave Munoz is a 20 y.o. male.  20 year old male presents with complaint of sore throat, headache, ear pain.  Patient states his symptoms started 2 days ago, he went to urgent care today where he had a negative Covid, mono, strep test.  Patient was given a prescription for amoxicillin however states that he feels like he has tonsil stones and would like to have them removed that he can feel better.  Patient is taking Tylenol at home without improvement in his pain, reports allergy to ibuprofen (causes swelling).  Dave Munoz was evaluated in Emergency Department on 06/24/2020 for the symptoms described in the history of present illness. He was evaluated in the context of the global COVID-19 pandemic, which necessitated consideration that the patient might be at risk for infection with the SARS-CoV-2 virus that causes COVID-19. Institutional protocols and algorithms that pertain to the evaluation of patients at risk for COVID-19 are in a state of rapid change based on information released by regulatory bodies including the CDC and federal and state organizations. These policies and algorithms were followed during the patient's care in the ED.         Past Medical History:  Diagnosis Date  . Abnormal EKG   . Allergic rhinitis   . Anxiety   . Asthma   . Postconcussion syndrome     Patient Active Problem List   Diagnosis Date Noted  . Allergic rhinitis due to pollen 12/02/2019  . Asthma 12/02/2019  . Postconcussion syndrome 12/02/2019    Past Surgical History:  Procedure Laterality Date  . NO PAST SURGERIES         Family History  Problem Relation Age of Onset  . Anxiety disorder Mother     Social History   Tobacco Use  . Smoking status: Never Smoker   . Smokeless tobacco: Never Used  Vaping Use  . Vaping Use: Never used  Substance Use Topics  . Alcohol use: Yes  . Drug use: Never    Home Medications Prior to Admission medications   Medication Sig Start Date End Date Taking? Authorizing Provider  acetaminophen (TYLENOL) 500 MG tablet Take 1,000 mg by mouth every 6 (six) hours as needed for mild pain.    [provider]  albuterol (VENTOLIN HFA) 108 (90 Base) MCG/ACT inhaler Inhale into the lungs every 6 (six) hours as needed.     [provider]  FLUoxetine (PROZAC) 20 MG capsule TAKE 1 CAPSULE(20 MG) BY MOUTH DAILY 04/20/20   Rodolph Bong, MD  hydrOXYzine (ATARAX/VISTARIL) 25 MG tablet Take 1 tablet (25 mg total) by mouth every 6 (six) hours. 12/14/19   Henderly, Britni A, PA-C  Multiple Vitamin (MULTIVITAMIN WITH MINERALS) TABS tablet Take 1 tablet by mouth daily.    [provider]    Allergies    Ibuprofen  Review of Systems   Review of Systems  Constitutional: Negative for fever.  HENT: Positive for ear pain and sore throat. Negative for congestion.   Respiratory: Negative for cough.   Gastrointestinal: Negative for nausea and vomiting.  Musculoskeletal: Negative for arthralgias and myalgias.  Skin: Negative for rash.  Allergic/Immunologic: Negative for immunocompromised state.  Neurological: Positive for headaches.  Hematological: Negative for adenopathy.  All other systems reviewed and  are negative.   Physical Exam Updated Vital Signs BP (!) 145/91 (BP Location: Left Arm)   Pulse 98   Temp 99.8 F (37.7 C) (Oral)   Resp 16   Ht 6\' 3"  (1.905 m)   Wt 75.3 kg   SpO2 98%   BMI 20.75 kg/m   Physical Exam Vitals and nursing note reviewed.  Constitutional:      General: He is not in acute distress.    Appearance: He is well-developed and well-nourished. He is not diaphoretic.  HENT:     Head: Normocephalic and atraumatic.     Right Ear: Ear canal normal. A middle ear effusion is  present. Tympanic membrane is not erythematous.     Left Ear: Ear canal normal. A middle ear effusion is present. Tympanic membrane is not erythematous.     Nose: No congestion.     Mouth/Throat:     Mouth: Mucous membranes are moist.     Pharynx: Uvula midline. No pharyngeal swelling or posterior oropharyngeal erythema.     Tonsils: Tonsillar exudate present. No tonsillar abscesses. 1+ on the right. 1+ on the left.  Eyes:     Conjunctiva/sclera: Conjunctivae normal.  Cardiovascular:     Rate and Rhythm: Normal rate and regular rhythm.  Pulmonary:     Effort: Pulmonary effort is normal.  Musculoskeletal:     Cervical back: Neck supple.  Lymphadenopathy:     Cervical: No cervical adenopathy.  Skin:    General: Skin is warm and dry.  Neurological:     Mental Status: He is alert and oriented to person, place, and time.  Psychiatric:        Mood and Affect: Mood and affect normal.        Behavior: Behavior normal.     ED Results / Procedures / Treatments   Labs (all labs ordered are listed, but only abnormal results are displayed) Labs Reviewed - No data to display  EKG None  Radiology No results found.  Procedures Procedures   Medications Ordered in ED Medications  dexamethasone (DECADRON) injection 10 mg (has no administration in time range)    ED Course  I have reviewed the triage vital signs and the nursing notes.  Pertinent labs & imaging results that were available during my care of the patient were reviewed by me and considered in my medical decision making (see chart for details).  Clinical Course as of 06/24/20 1647  Fri Jun 24, 2020  814 20 year old male with complaint of sore throat, ear pain, headache x2 days.  On exam found to have mildly enlarged tonsils with slight exudate, no appreciable cervical lymphadenopathy, is able to tolerate secretions, no noted change in voice.  Patient was seen in urgent care today and tested negative for strep, mono, Covid.   Patient was given prescription for amoxicillin.  Encouraged patient to take this prescription for his tonsillitis, will give a dose of Decadron today in the ED due to reports of significant pain not improving with Tylenol alone.  Advised patient that we will also help with his discomfort.  Recommend continue with Tylenol as needed as directed. [LM]    Clinical Course User Index [LM] 36   MDM Rules/Calculators/A&P                          Final Clinical Impression(s) / ED Diagnoses Final diagnoses:  Tonsillitis    Rx / DC Orders ED Discharge Orders  None       Alden Hipp 06/24/20 1647    Terald Sleeper, MD 06/25/20 [facility] Manners

## 2020-06-24 NOTE — ED Triage Notes (Signed)
Patient reports that he has had a sore throat, bilateral otalgia, and a headache x 2 days. Patient went to [facility] today and states he had a Covid test that was negative. and a strep test that was negative. Patient states he received a prescription for Amoxicillin.

## 2020-06-24 NOTE — Discharge Instructions (Signed)
You were given a dose of Decadron today in the emergency room, this is a steroid which should help reduce the swelling in your tonsils over the next 24 hours. Take Tylenol as needed as directed for pain, you can take the liquid children's Tylenol which may be easier to swallow. Home to rest and hydrate, it is painful to swallow however staying hydrated will help improve your symptoms faster.

## 2020-06-25 ENCOUNTER — Other Ambulatory Visit: Payer: Self-pay

## 2020-06-25 ENCOUNTER — Encounter (HOSPITAL_COMMUNITY): Payer: Self-pay

## 2020-06-25 ENCOUNTER — Emergency Department (HOSPITAL_COMMUNITY)
Admission: EM | Admit: 2020-06-25 | Discharge: 2020-06-25 | Disposition: A | Discharge status: Home / Self Care | Payer: PRIVATE HEALTH INSURANCE | Attending: Emergency Medicine | Admitting: Emergency Medicine

## 2020-06-25 DIAGNOSIS — J45909 Unspecified asthma, uncomplicated: Secondary | ICD-10-CM | POA: Insufficient documentation

## 2020-06-25 DIAGNOSIS — J039 Acute tonsillitis, unspecified: Secondary | ICD-10-CM | POA: Insufficient documentation

## 2020-06-25 MED ORDER — LIDOCAINE VISCOUS HCL 2 % MT SOLN
15.0000 mL | Freq: Once | OROMUCOSAL | Status: AC
  Administered 2020-06-25: 15 mL via OROMUCOSAL
  Filled 2020-06-25: qty 15

## 2020-06-25 MED ORDER — LIDOCAINE VISCOUS HCL 2 % MT SOLN
15.0000 mL | OROMUCOSAL | 0 refills | Status: AC | PRN
Start: 2020-06-25 — End: ?

## 2020-06-25 MED ORDER — DEXAMETHASONE 4 MG PO TABS
6.0000 mg | ORAL_TABLET | Freq: Once | ORAL | Status: AC
  Administered 2020-06-25: 6 mg via ORAL
  Filled 2020-06-25: qty 1

## 2020-06-25 MED ORDER — DEXAMETHASONE SODIUM PHOSPHATE 10 MG/ML IJ SOLN: 10.0000 mg | Freq: Once | INTRAMUSCULAR | Status: DC

## 2020-06-25 NOTE — ED Triage Notes (Signed)
Pt seen here for same yesterday. He has a dx of tonsillitis. He is currently on Amoxicillin. Pt requesting another decadron shot

## 2020-06-25 NOTE — Discharge Instructions (Signed)
Continue treating with tylenol. Use throat lozenges, honey, or the lidocaine solution for additional relief. Follow with your primary care. Reasons to return to the ED include inability to swallow liquids, difficulty breathing, severely worsening swelling in your throat.

## 2020-06-25 NOTE — ED Provider Notes (Addendum)
[facility]-EMERGENCY DEPT Provider Note   CSN: [id number] Arrival date & time: 06/25/20  2236     History Chief Complaint  Patient presents with  . Sore Throat    Dave Munoz is a 20 y.o. male presenting to the ED for persisting throat pain and swelling.  Patient was evaluated at both urgent care in the ED yesterday for his tonsillitis.  He was tested for Covid, strep, and mono and were negative.  He was prescribed amoxicillin at urgent care.  He has been taking as prescribed.  He states he had a Decadron shot yesterday in the ED and that provided significant relief.  He feels as though the Decadron is wearing off and he is requesting an additional injection.  Denies difficulty breathing or swallowing.  He is treated with 1000 mg of Tylenol every 6 hours and recently purchased throat lozenges.  No new symptoms.  The history is provided by the patient and medical records.       Past Medical History:  Diagnosis Date  . Abnormal EKG   . Allergic rhinitis   . Anxiety   . Asthma   . Postconcussion syndrome     Patient Active Problem List   Diagnosis Date Noted  . Allergic rhinitis due to pollen 12/02/2019  . Asthma 12/02/2019  . Postconcussion syndrome 12/02/2019    Past Surgical History:  Procedure Laterality Date  . NO PAST SURGERIES         Family History  Problem Relation Age of Onset  . Anxiety disorder Mother     Social History   Tobacco Use  . Smoking status: Never Smoker  . Smokeless tobacco: Never Used  Vaping Use  . Vaping Use: Never used  Substance Use Topics  . Alcohol use: Yes  . Drug use: Never    Home Medications Prior to Admission medications   Medication Sig Start Date End Date Taking? Authorizing Provider  lidocaine (XYLOCAINE) 2 % solution Use as directed 15 mLs in the mouth or throat as needed for mouth pain. 06/25/20  Yes Jevon Littlepage, [place] N, PA-C  acetaminophen (TYLENOL) 500 MG tablet Take 1,000 mg by mouth  every 6 (six) hours as needed for mild pain.    [provider]  albuterol (VENTOLIN HFA) 108 (90 Base) MCG/ACT inhaler Inhale into the lungs every 6 (six) hours as needed.     [provider]  FLUoxetine (PROZAC) 20 MG capsule TAKE 1 CAPSULE(20 MG) BY MOUTH DAILY 04/20/20   Rodolph Bong, MD  hydrOXYzine (ATARAX/VISTARIL) 25 MG tablet Take 1 tablet (25 mg total) by mouth every 6 (six) hours. 12/14/19   Henderly, Britni A, PA-C  Multiple Vitamin (MULTIVITAMIN WITH MINERALS) TABS tablet Take 1 tablet by mouth daily.    [provider]    Allergies    Ibuprofen  Review of Systems   Review of Systems  Constitutional: Negative for fever.  HENT: Positive for sore throat.     Physical Exam Updated Vital Signs BP 134/85 (BP Location: Right Arm)   Pulse 87   Temp 98.7 F (37.1 C) (Oral)   Resp 18   Ht 6\' 3"  (1.905 m)   Wt 75.3 kg   SpO2 99%   BMI 20.75 kg/m   Physical Exam Vitals and nursing note reviewed.  Constitutional:      General: He is not in acute distress.    Appearance: He is well-developed.  HENT:     Head: Normocephalic and atraumatic.  Mouth/Throat:     Mouth: Mucous membranes are moist.     Comments: Posterior oropharyngeal erythema, there is mild to moderate bilateral tonsillar edema with few exudates.  Uvula is midline, no trismus.  Tolerating secretions.  No voice changes. Eyes:     Conjunctiva/sclera: Conjunctivae normal.  Cardiovascular:     Rate and Rhythm: Normal rate and regular rhythm.  Pulmonary:     Effort: Pulmonary effort is normal.     Breath sounds: No stridor.  Musculoskeletal:     Cervical back: Normal range of motion and neck supple.  Neurological:     Mental Status: He is alert.  Psychiatric:        Mood and Affect: Mood normal.        Behavior: Behavior normal.     ED Results / Procedures / Treatments   Labs (all labs ordered are listed, but only abnormal results are displayed) Labs Reviewed - No data to  display  EKG None  Radiology No results found.  Procedures Procedures   Medications Ordered in ED Medications  lidocaine (XYLOCAINE) 2 % viscous mouth solution 15 mL (has no administration in time range)  dexamethasone (DECADRON) tablet 6 mg (has no administration in time range)    ED Course  I have reviewed the triage vital signs and the nursing notes.  Pertinent labs & imaging results that were available during my care of the patient were reviewed by me and considered in my medical decision making (see chart for details).    MDM Rules/Calculators/A&P                          Patient presenting back to the ED with request for additional dose of Decadron after being seen at urgent care and in the ED yesterday for tonsillitis.  He is compliant with amoxicillin.  Treating with Tylenol.  Unable to take NSAIDs due to allergy.  On exam he is tolerating secretions, exam is not concerning for PTA.  He does have some tonsillar edema with few exudates.  History of renal compromise.  Considering patient is unable to take NSAIDs, will give 1 more dose of Decadron for symptom relief - though discussed with patient that he is to follow-up with PCP or urgent care for any additional symptom management. Prescribed viscous lidocaine, discussed additional symptom management.  Discussed reasons to return to the ED including trismus, inability to tolerate secretions, difficulty breathing. Patient discharged in no distress.  Final Clinical Impression(s) / ED Diagnoses Final diagnoses:  Tonsillitis    Rx / DC Orders ED Discharge Orders         Ordered    lidocaine (XYLOCAINE) 2 % solution  As needed        06/25/20 2313           Armya Westerhoff, [place] N, PA-C 06/25/20 2320    Elier Zellars, [place] N, PA-C 06/25/20 2326    Shon Baton, MD 06/26/20 [phone]

## 2020-09-01 ENCOUNTER — Other Ambulatory Visit: Payer: Self-pay | Admitting: Family Medicine

## 2020-09-01 NOTE — Telephone Encounter (Signed)
Please advise 

## 2020-11-30 ENCOUNTER — Other Ambulatory Visit: Payer: Self-pay | Admitting: Family Medicine

## 2020-11-30 NOTE — Telephone Encounter (Signed)
Rx refill request approved per Dr. Corey's orders. 

## 2021-06-07 ENCOUNTER — Other Ambulatory Visit: Payer: Self-pay | Admitting: Family Medicine

## 2021-06-07 NOTE — Telephone Encounter (Signed)
Rx refill request approved per Dr. Corey's orders. 

## 2021-08-03 IMAGING — CT CT HEAD W/O CM
3 series · 15 of 47 positions shown, 18 images · non-contrast
Comparison: None.

CLINICAL DATA: Dizziness

EXAM:
CT HEAD WITHOUT CONTRAST
TECHNIQUE: Contiguous axial images were obtained from the base of the skull
through the vertex without intravenous contrast.

[Series 2: head wo · axial · 0.44mm/px · z∈[-178,-43]mm · 9 of 33 slices shown, 12 images]
[im 3/33  brain]
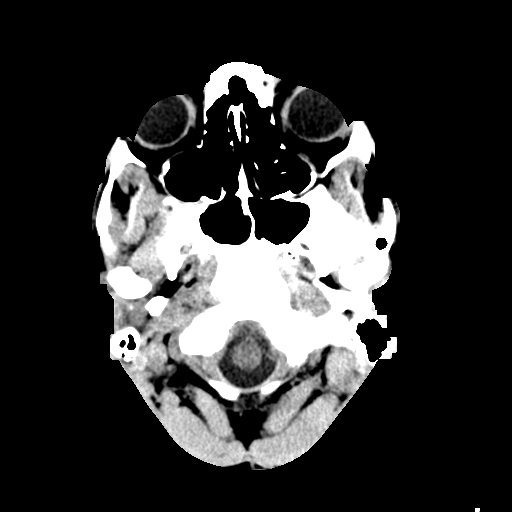
[im 3/33  bone]
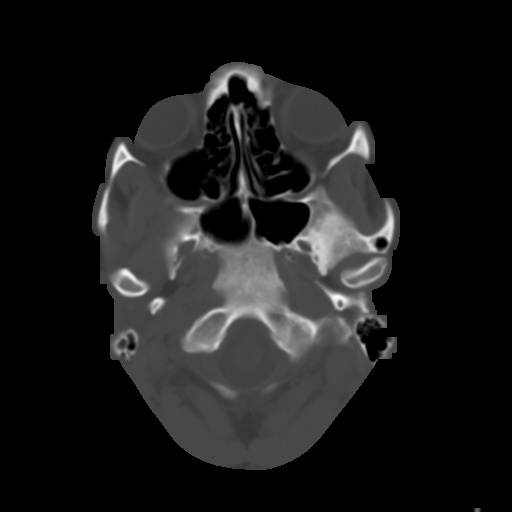
[im 6/33  brain]
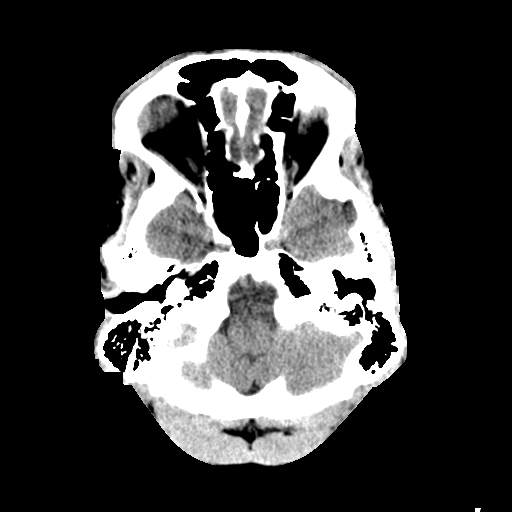
[im 9/33  brain]
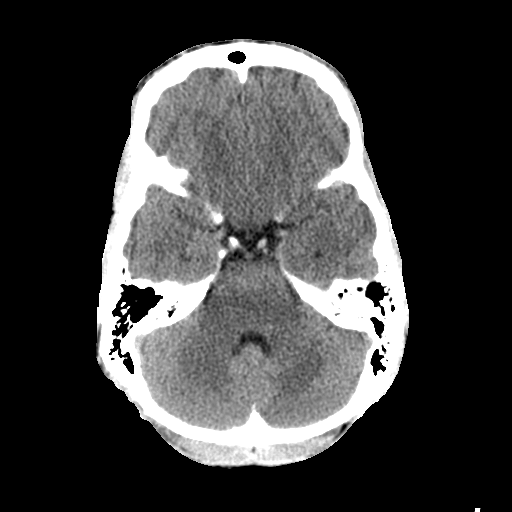
[im 13/33  brain]
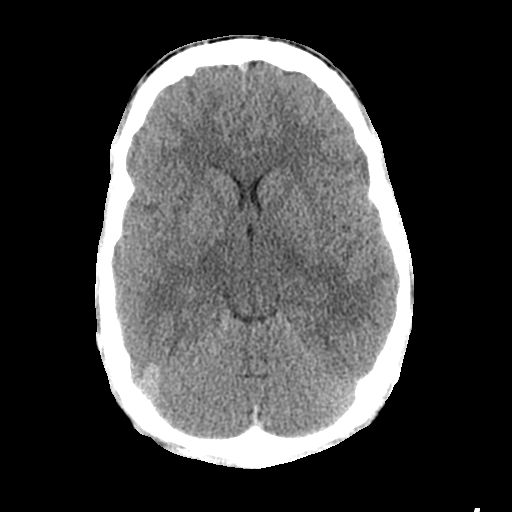
[im 17/33  brain]
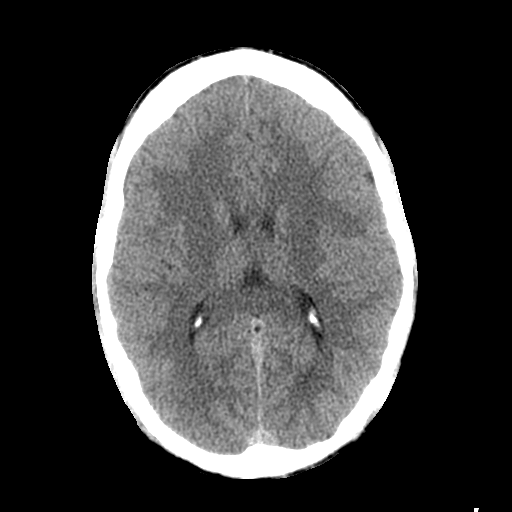
[im 17/33  bone]
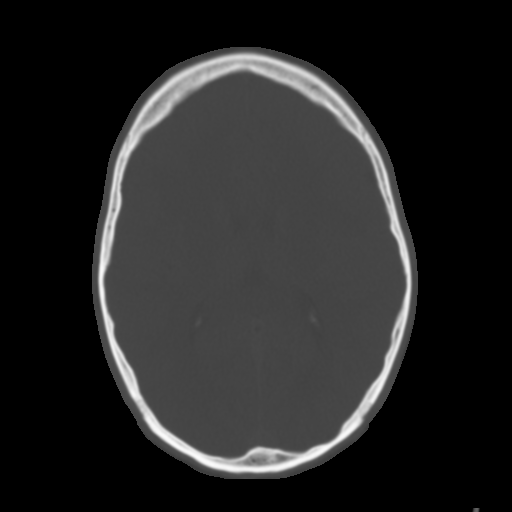
[im 20/33  brain]
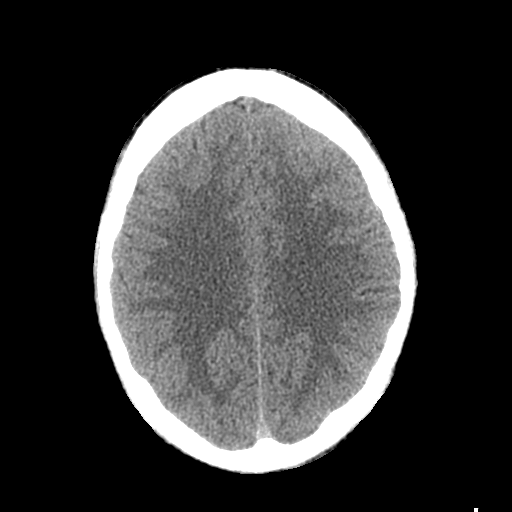
[im 24/33  brain]
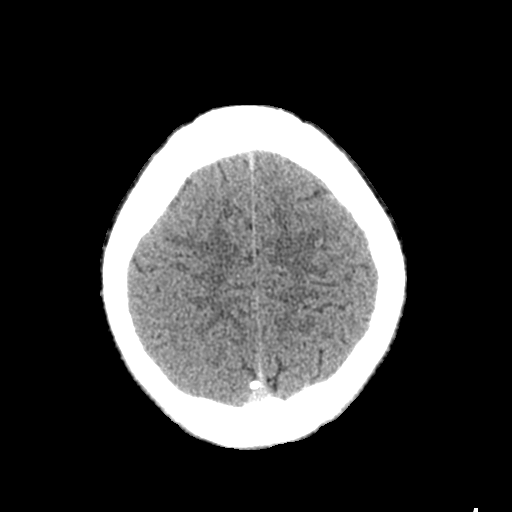
[im 27/33  brain]
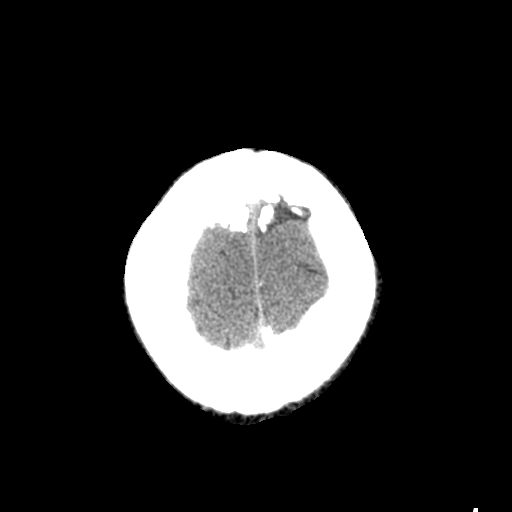
[im 30/33  brain]
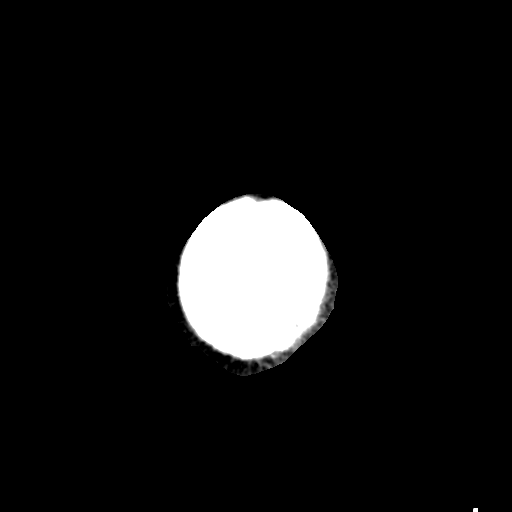
[im 30/33  bone]
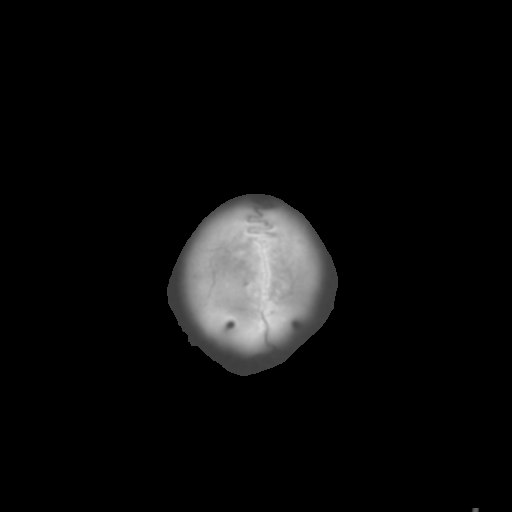

[Series 5: coronal soft tissue · coronal · 0.32mm/px · 3 of 72 slices shown]
[im 24/72  brain]
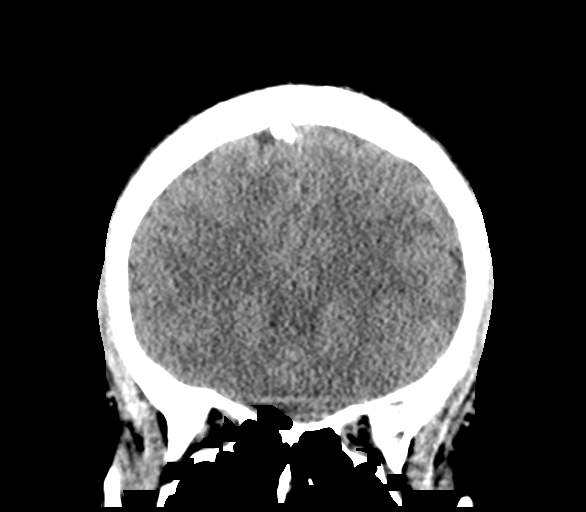
[im 32/72  brain]
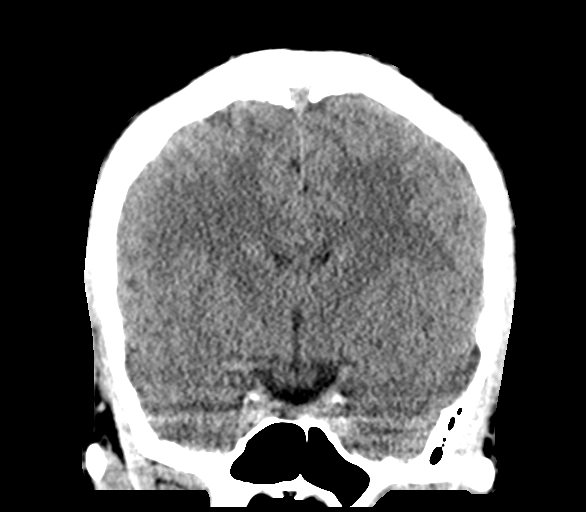
[im 40/72  brain]
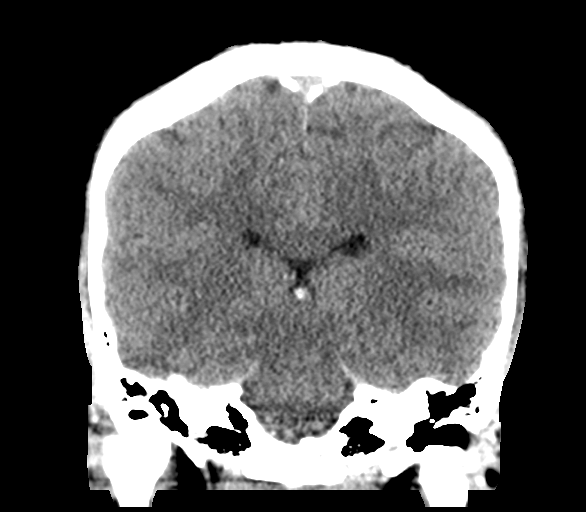

[Series 6: sagittal soft tissue · sagittal · 0.33mm/px · 3 of 59 slices shown]
[im 20/59  brain]
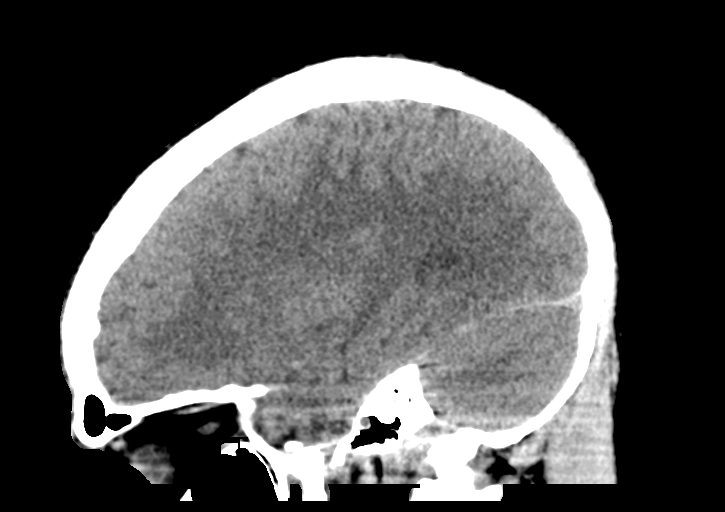
[im 30/59  brain]
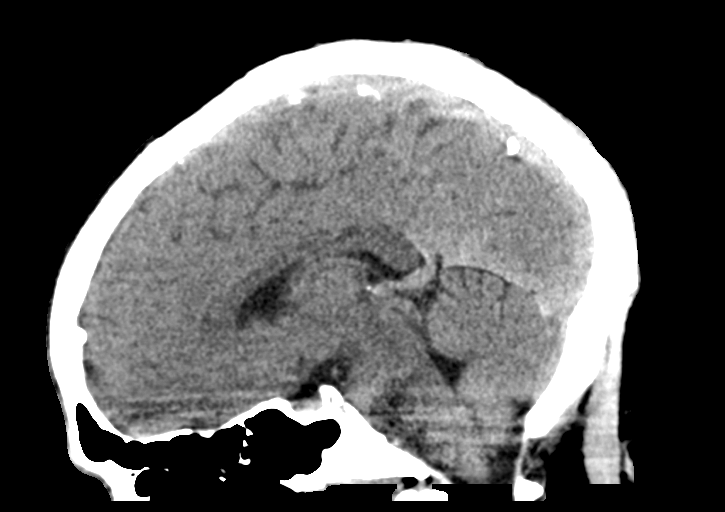
[im 39/59  brain]
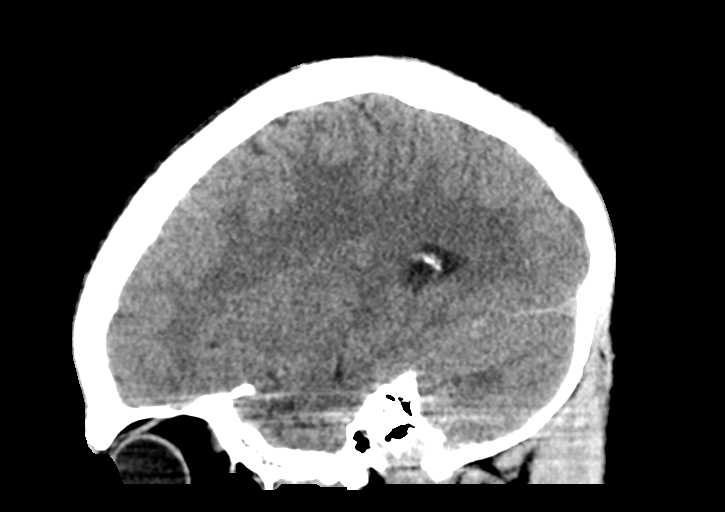

[15 of 47 positions shown; findings below may reference images not displayed]

FINDINGS: Brain: No evidence of acute infarction, hemorrhage, hydrocephalus,
extra-axial collection or mass lesion/mass effect.

Vascular: No hyperdense vessel or unexpected calcification.

Skull: Normal. Negative for fracture or focal lesion.

Sinuses/Orbits: No acute finding.

Other: None
IMPRESSION: Negative non contrasted CT appearance of the brain

## 2021-08-20 IMAGING — DX DG CHEST 1V PORT
2 series · 2 of 2 positions shown · non-contrast
Comparison: None.

CLINICAL DATA: Palpitations, anxiety

EXAM:
PORTABLE CHEST 1 VIEW

[chest ap (1 of 2)]
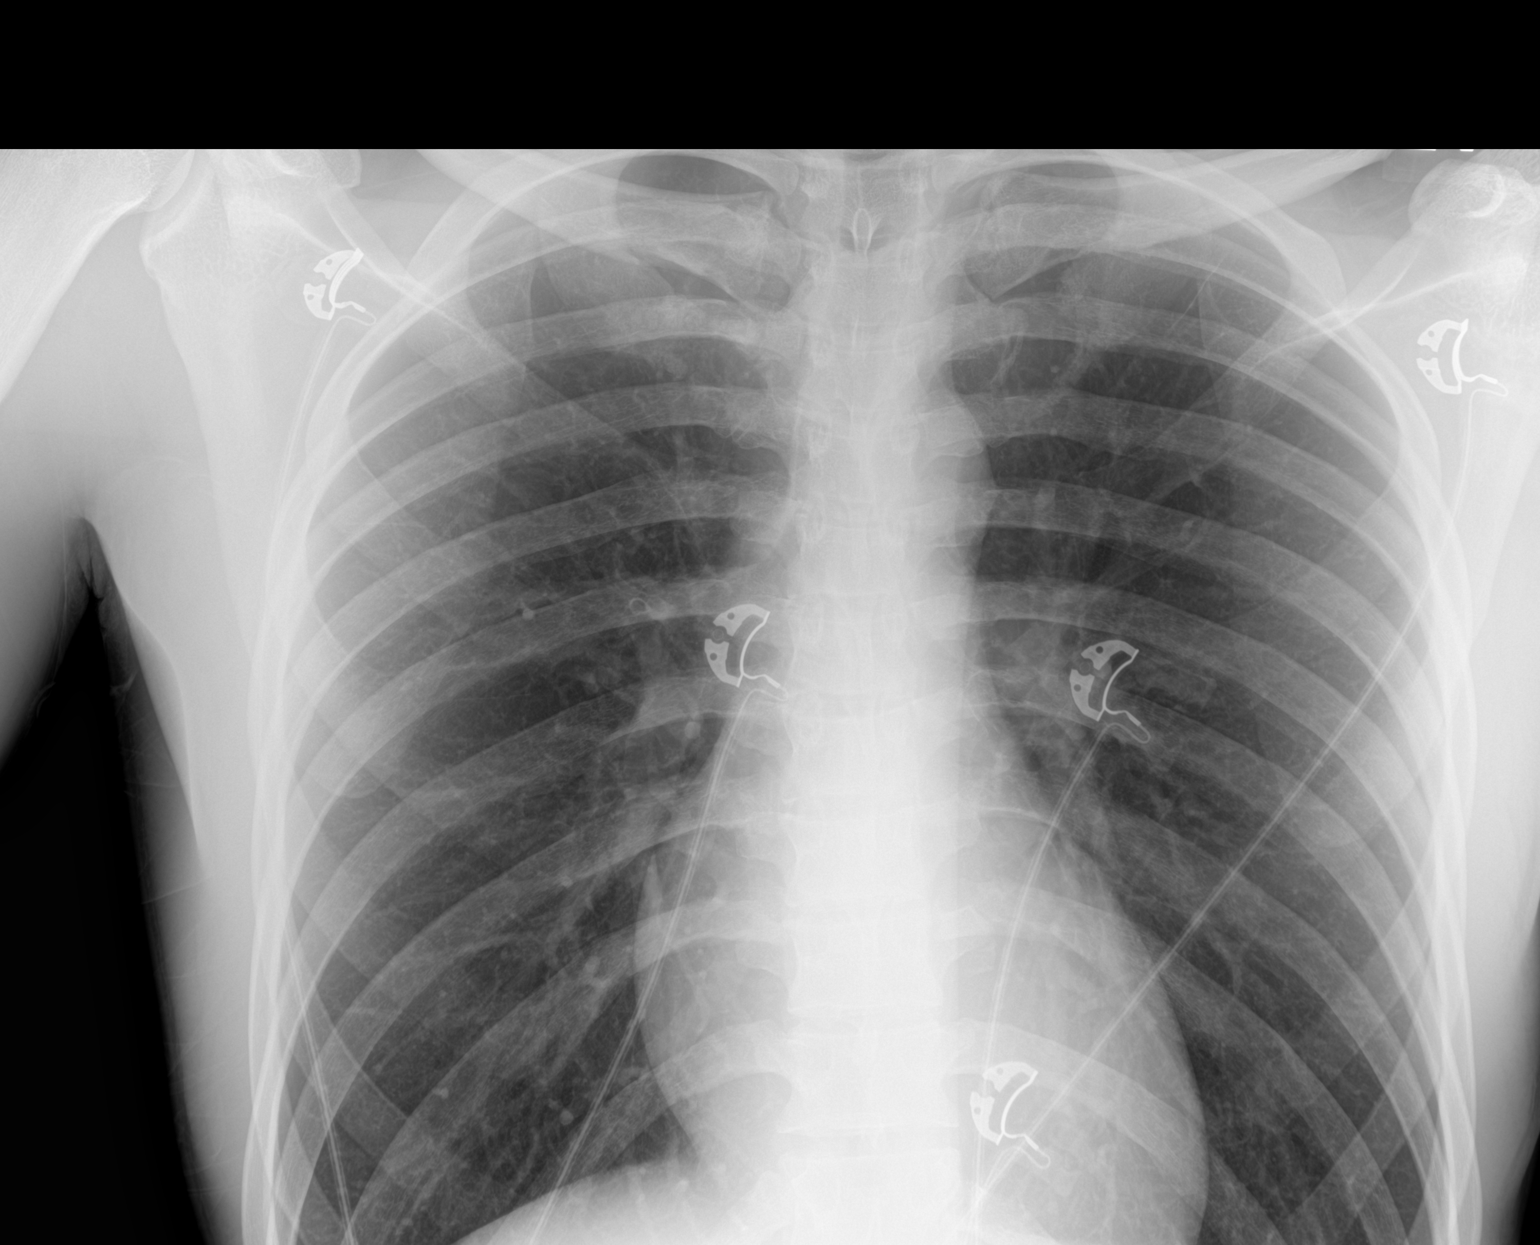

[chest ap (2 of 2)]
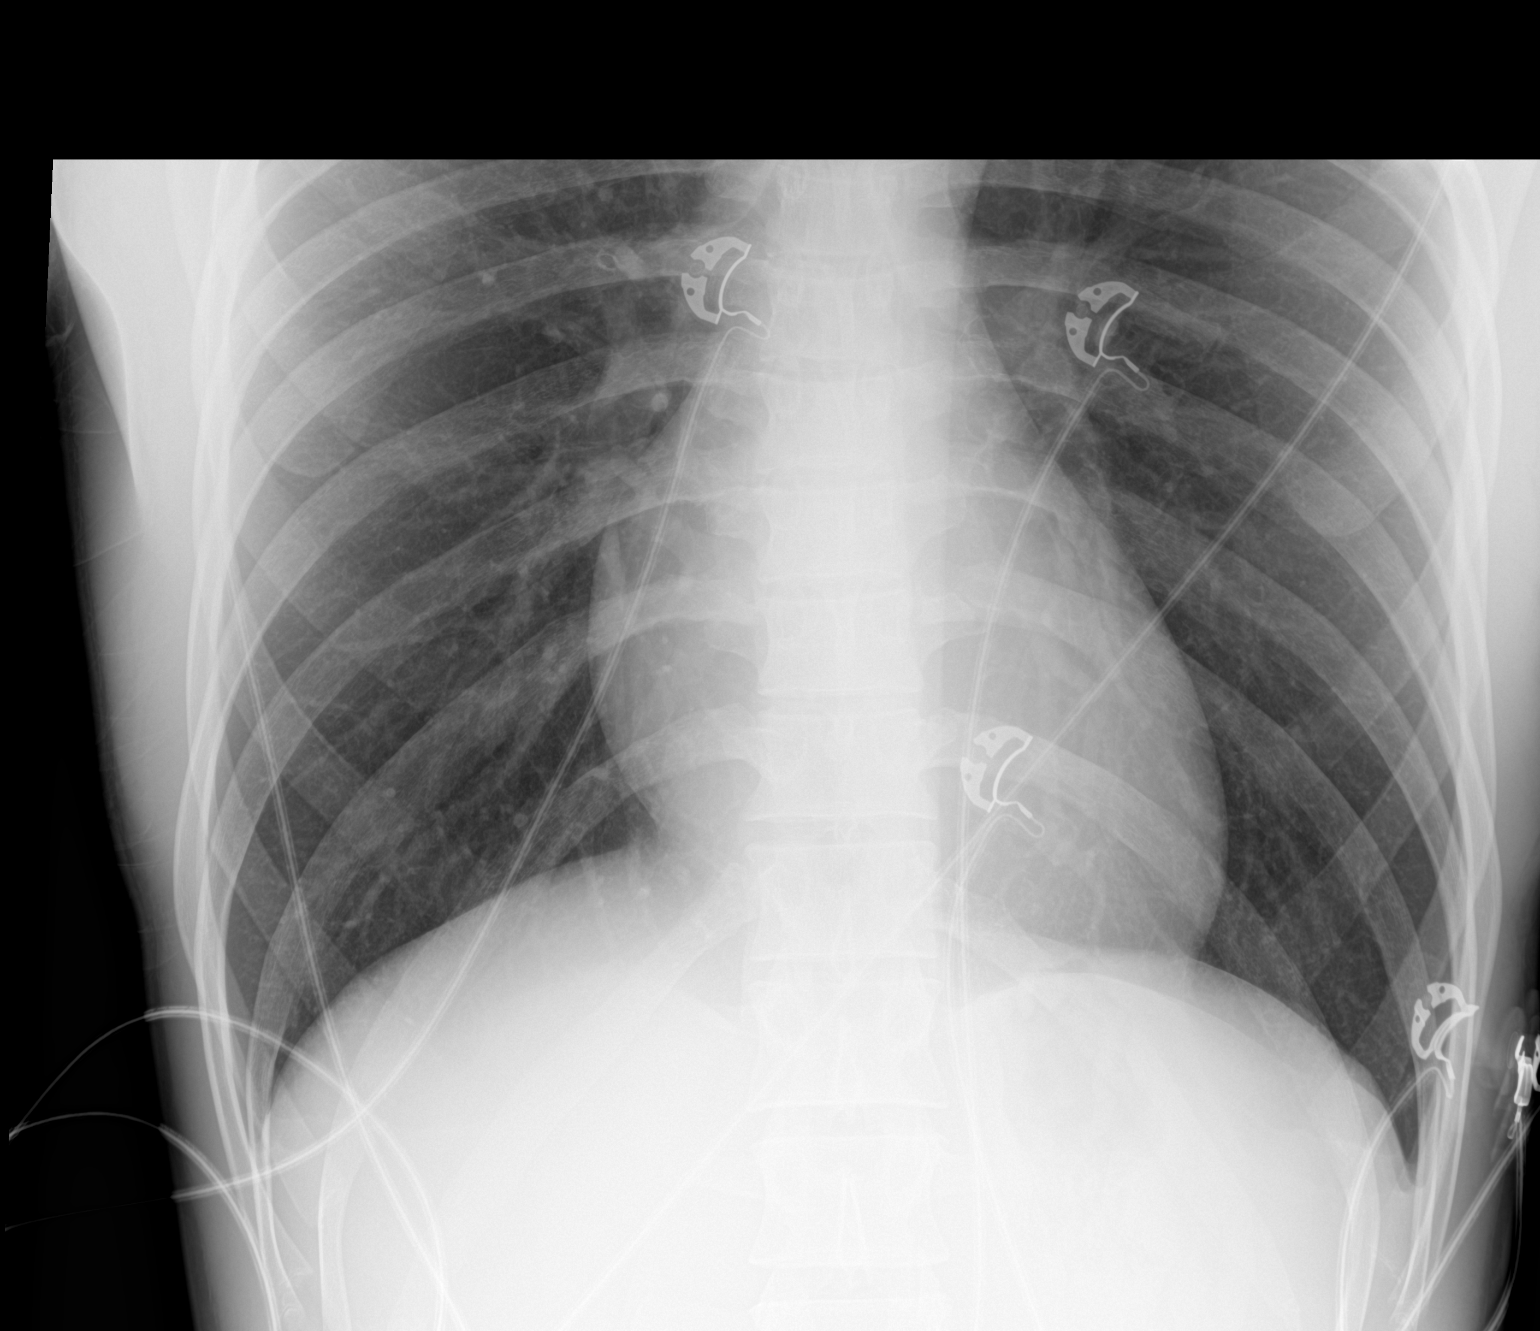

[2 of 2 positions shown; findings below may reference images not displayed]

FINDINGS: No consolidation, features of edema, pneumothorax, or effusion.
Pulmonary vascularity is normally distributed. The cardiomediastinal
contours are unremarkable. No acute osseous or soft tissue
abnormality. Telemetry leads overlie the chest.
IMPRESSION: No acute cardiopulmonary abnormality.

## 2021-09-03 ENCOUNTER — Other Ambulatory Visit: Payer: Self-pay | Admitting: Family Medicine

## 2021-09-04 NOTE — Telephone Encounter (Signed)
Rx refill request approved per Dr. Clovis Riley orders. Pt sent a MyChart message that after this refill, this medication should be prescribed and monitored by his PCP, as he has not seen Dr. Georgina Snell on July 2012. ?

## 2023-12-02 ENCOUNTER — Emergency Department (HOSPITAL_BASED_OUTPATIENT_CLINIC_OR_DEPARTMENT_OTHER)
Admission: EM | Admit: 2023-12-02 | Discharge: 2023-12-02 | Disposition: A | Discharge status: Home / Self Care | Attending: Emergency Medicine | Admitting: Emergency Medicine

## 2023-12-02 ENCOUNTER — Encounter (HOSPITAL_BASED_OUTPATIENT_CLINIC_OR_DEPARTMENT_OTHER): Payer: Self-pay | Admitting: *Deleted

## 2023-12-02 DIAGNOSIS — J4521 Mild intermittent asthma with (acute) exacerbation: Secondary | ICD-10-CM | POA: Insufficient documentation

## 2023-12-02 DIAGNOSIS — R0602 Shortness of breath: Secondary | ICD-10-CM | POA: Diagnosis present

## 2023-12-02 MED ORDER — IPRATROPIUM-ALBUTEROL 0.5-2.5 (3) MG/3ML IN SOLN
3.0000 mL | Freq: Once | RESPIRATORY_TRACT | Status: AC
  Administered 2023-12-02: 3 mL via RESPIRATORY_TRACT
  Filled 2023-12-02: qty 3

## 2023-12-02 MED ORDER — ALBUTEROL SULFATE (2.5 MG/3ML) 0.083% IN NEBU
2.5000 mg | INHALATION_SOLUTION | Freq: Once | RESPIRATORY_TRACT | Status: AC
  Administered 2023-12-02: 2.5 mg via RESPIRATORY_TRACT
  Filled 2023-12-02: qty 3

## 2023-12-02 MED ORDER — ALBUTEROL SULFATE HFA 108 (90 BASE) MCG/ACT IN AERS
2.0000 | INHALATION_SPRAY | RESPIRATORY_TRACT | Status: DC | PRN
  Administered 2023-12-02: 2 via RESPIRATORY_TRACT
  Filled 2023-12-02: qty 6.7

## 2023-12-02 NOTE — ED Provider Notes (Signed)
 [facility]  Provider Note  CSN: [id number] Arrival date & time: 12/02/23 0104  History Chief Complaint  Patient presents with   Shortness of Breath    Dave Munoz is a 23 y.o. male with history of asthma, does not have symptoms often and typically managed with OTC inhalers, but reports breathing was worse today. No fever or sputum. No other recent illness. Does not smoke. He was given neb in triage with significant improvement.    Home Medications Prior to Admission medications   Medication Sig Start Date End Date Taking? Authorizing Provider  acetaminophen (TYLENOL) 500 MG tablet Take 1,000 mg by mouth every 6 (six) hours as needed for mild pain.    [provider]  albuterol  (VENTOLIN  HFA) 108 (90 Base) MCG/ACT inhaler Inhale into the lungs every 6 (six) hours as needed.     [provider]  FLUoxetine  (PROZAC ) 20 MG capsule TAKE 1 CAPSULE(20 MG) BY MOUTH DAILY 09/04/21   Corey, Evan S, MD  hydrOXYzine  (ATARAX /VISTARIL ) 25 MG tablet Take 1 tablet (25 mg total) by mouth every 6 (six) hours. 12/14/19   Henderly, Britni A, PA-C  lidocaine  (XYLOCAINE ) 2 % solution Use as directed 15 mLs in the mouth or throat as needed for mouth pain. 06/25/20   Robinson, Jordan N, PA-C  Multiple Vitamin (MULTIVITAMIN WITH MINERALS) TABS tablet Take 1 tablet by mouth daily.    [provider]     Allergies    Ibuprofen   Review of Systems   Review of Systems Please see HPI for pertinent positives and negatives  Physical Exam BP (!) 131/90 (BP Location: Right Arm)   Pulse 90   Temp 97.8 F (36.6 C) (Oral)   Resp (!) 24   Ht 6' 2 (1.88 m)   Wt 78 kg   SpO2 100%   BMI 22.08 kg/m   Physical Exam Vitals and nursing note reviewed.  Constitutional:      Appearance: Normal appearance.  HENT:     Head: Normocephalic and atraumatic.     Nose: Nose normal.     Mouth/Throat:     Mouth: Mucous membranes are moist.   Eyes:     Extraocular Movements: Extraocular movements intact.     Conjunctiva/sclera: Conjunctivae normal.  Cardiovascular:     Rate and Rhythm: Normal rate.  Pulmonary:     Effort: Pulmonary effort is normal.     Breath sounds: Normal breath sounds.  Abdominal:     General: Abdomen is flat.     Palpations: Abdomen is soft.     Tenderness: There is no abdominal tenderness.  Musculoskeletal:        General: No swelling. Normal range of motion.     Cervical back: Neck supple.  Skin:    General: Skin is warm and dry.  Neurological:     General: No focal deficit present.     Mental Status: He is alert.  Psychiatric:        Mood and Affect: Mood normal.     ED Results / Procedures / Treatments   EKG None  Procedures Procedures  Medications Ordered in the ED Medications  albuterol  (VENTOLIN  HFA) 108 (90 Base) MCG/ACT inhaler 2 puff (has no administration in time range)  ipratropium-albuterol  (DUONEB) 0.5-2.5 (3) MG/3ML nebulizer solution 3 mL (3 mLs Nebulization Given 12/02/23 0115)  albuterol  (PROVENTIL ) (2.5 MG/3ML) 0.083% nebulizer solution 2.5 mg (2.5 mg Nebulization Given 12/02/23 0115)    Initial Impression  and Plan  Patient with history of asthma here with wheezing/SOB, improved after neb in triage. Will give inhaler to go home, doubt utility of steroids given rapid improvement and short duration of symptoms. Recommend PCP follow up for long term management. RTED for other concerns.   ED Course       MDM Rules/Calculators/A&P Medical Decision Making Problems Addressed: Mild intermittent asthma with exacerbation: acute illness or injury  Risk Prescription drug management.     Final Clinical Impression(s) / ED Diagnoses Final diagnoses:  Mild intermittent asthma with exacerbation    Rx / DC Orders ED Discharge Orders     None        Roselyn Carlin NOVAK, MD 12/02/23 [phone]

## 2023-12-02 NOTE — ED Triage Notes (Addendum)
 Pt present with asthma exacerbation. Patient assessed and treat by resp therapist to stabilize I initially. Patient endorse not knowing what triggered the flare however took home med without resolve. Pt sts he is in the process of moving. And he has cats.

## 2023-12-13 ENCOUNTER — Emergency Department (HOSPITAL_BASED_OUTPATIENT_CLINIC_OR_DEPARTMENT_OTHER)
Admission: EM | Admit: 2023-12-13 | Discharge: 2023-12-13 | Disposition: A | Discharge status: Home / Self Care | Attending: Emergency Medicine | Admitting: Emergency Medicine

## 2023-12-13 ENCOUNTER — Other Ambulatory Visit: Payer: Self-pay

## 2023-12-13 DIAGNOSIS — F419 Anxiety disorder, unspecified: Secondary | ICD-10-CM | POA: Insufficient documentation

## 2023-12-13 DIAGNOSIS — J45909 Unspecified asthma, uncomplicated: Secondary | ICD-10-CM | POA: Diagnosis not present

## 2023-12-13 DIAGNOSIS — T887XXA Unspecified adverse effect of drug or medicament, initial encounter: Secondary | ICD-10-CM | POA: Insufficient documentation

## 2023-12-13 MED ORDER — HYDROXYZINE HCL 25 MG PO TABS
25.0000 mg | ORAL_TABLET | Freq: Once | ORAL | Status: AC
  Administered 2023-12-13: 25 mg via ORAL
  Filled 2023-12-13: qty 1

## 2023-12-13 NOTE — ED Triage Notes (Signed)
 Patient states used his inhaler around 2am and felt jittery. Lasted for 1 hour. Symptoms returned this morning without using inhaler. Patient states unsure if side effect of albuterol  or anxiety.

## 2023-12-13 NOTE — ED Provider Notes (Signed)
 [place] [facility]MENT AT [facility] Provider Note   CSN: [id number] Arrival date & time: 12/13/23  9050     Patient presents with: Medication Reaction   Deacon Gadbois is a 23 y.o. male.   23 year old male presenting with medication reaction.  Patient has history of asthma, was seen in this emergency department 11 days ago as he was having asthma symptoms and had run out of his inhalers.  Around 2 AM he used his albuterol  inhaler, he then began to feel jittery and feeling like he needs to get up and move around, the symptoms did subside but returned this morning briefly.  He has never had symptoms like this after using an inhaler, he admits that he has a history of anxiety and feels that his anxiety is contributing to this. Currently he denies chest pain, shortness of breath, wheezing, cough, chest tightness, other asthma symptoms.         Prior to Admission medications   Medication Sig Start Date End Date Taking? Authorizing Provider  acetaminophen (TYLENOL) 500 MG tablet Take 1,000 mg by mouth every 6 (six) hours as needed for mild pain.    [provider]  albuterol  (VENTOLIN  HFA) 108 (90 Base) MCG/ACT inhaler Inhale into the lungs every 6 (six) hours as needed.     [provider]  FLUoxetine  (PROZAC ) 20 MG capsule TAKE 1 CAPSULE(20 MG) BY MOUTH DAILY 09/04/21   Corey, Evan S, MD  hydrOXYzine  (ATARAX /VISTARIL ) 25 MG tablet Take 1 tablet (25 mg total) by mouth every 6 (six) hours. 12/14/19   Henderly, Britni A, PA-C  lidocaine  (XYLOCAINE ) 2 % solution Use as directed 15 mLs in the mouth or throat as needed for mouth pain. 06/25/20   Robinson, Jordan N, PA-C  Multiple Vitamin (MULTIVITAMIN WITH MINERALS) TABS tablet Take 1 tablet by mouth daily.    [provider]    Allergies: Ibuprofen    Review of Systems  Updated Vital Signs BP 136/87   Pulse 71   Temp 98.1 F (36.7 C) (Oral)   Resp 16   SpO2 99%   Physical Exam Vitals  and nursing note reviewed.  HENT:     Head: Normocephalic.  Eyes:     Extraocular Movements: Extraocular movements intact.  Cardiovascular:     Rate and Rhythm: Normal rate and regular rhythm.     Heart sounds: Normal heart sounds.  Pulmonary:     Effort: Pulmonary effort is normal.     Breath sounds: Normal breath sounds. No wheezing.  Musculoskeletal:     Cervical back: Normal range of motion.     Comments: Moves all extremities spontaneously without difficulty  Skin:    General: Skin is warm and dry.  Neurological:     Mental Status: He is alert and oriented to person, place, and time.     (all labs ordered are listed, but only abnormal results are displayed) Labs Reviewed - No data to display  EKG: EKG Interpretation Date/Time:  Friday December 13 2023 10:59:59 EDT Ventricular Rate:  64 PR Interval:  157 QRS Duration:  101 QT Interval:  384 QTC Calculation: 397 R Axis:   87  Text Interpretation: Sinus rhythm Probable left ventricular hypertrophy ST elev, probable normal early repol pattern No significant change since last tracing Confirmed by Patsey Lot [phone]) on 12/13/2023 11:09:00 AM  Radiology: No results found.   Procedures   Medications Ordered in the ED  hydrOXYzine  (ATARAX ) tablet 25 mg (has no administration in  time range)                                    Medical Decision Making This patient presents to the ED for concern of medication side effect, this involves an extensive number of treatment options, and is a complaint that carries with it a high risk of complications and morbidity.  The differential diagnosis includes anxiety, panic attack, tachycardia, asthma exacerbation.   Co morbidities that complicate the patient evaluation  History of asthma, history of anxiety   Additional history obtained:  Additional history obtained from record review External records from outside source obtained and reviewed including recent ED  note   Cardiac Monitoring: / EKG:  The patient was maintained on a cardiac monitor.  I personally viewed and interpreted the cardiac monitored which showed an underlying rhythm of: NSR  Problem List / ED Course / Critical interventions / Medication management  I ordered medication including hydroxyzine  for anxiety Reevaluation of the patient after these medicines showed that the patient improved I have reviewed the patients home medicines and have made adjustments as needed   Test / Admission - Considered:  Physical exam is largely unremarkable, lungs are clear to auscultation bilaterally without adventitious sounds/wheezing, normal S1/S2, no tachycardia, vitals are reassuring.  I suspect that the symptoms patient experienced last night/this morning are tachycardia related to albuterol  use along with anxiety.  Patient has had similar issues with anxiety in the past, was previously on an antianxiety medication but admits that he does not have a primary care provider in this area and does not consistently take this medication, it seems that he takes his medication every once in a while when he feels anxiety symptoms coming on.  I provided him with educational material in managing anxiety, I will provide him with the contact information for [place] [facility] as he does not have a primary care provider in the area.  He voiced understanding and is in agreement with this plan, return precautions discussed, he is appropriate for discharge at this time.    Risk Prescription drug management.       Final diagnoses:  Medication side effect  Anxiety    ED Discharge Orders     None          Donabelle Molden N, PA-C 12/13/23 1125    Patsey Lot, MD 12/13/23 1426

## 2023-12-13 NOTE — Discharge Instructions (Signed)
 I have provided you with the contact information for [place] [facility], you may call them to schedule a follow-up and establish care since you do not have a primary care provider in the area.  Return to the emergency department if you experience chest pain, shortness of breath, or asthma symptoms that are not relieved with the use of your inhaler.

## 2024-03-09 ENCOUNTER — Other Ambulatory Visit: Payer: Self-pay

## 2024-03-09 ENCOUNTER — Emergency Department (HOSPITAL_BASED_OUTPATIENT_CLINIC_OR_DEPARTMENT_OTHER): Admitting: Radiology

## 2024-03-09 DIAGNOSIS — M25512 Pain in left shoulder: Secondary | ICD-10-CM | POA: Diagnosis present

## 2024-03-09 LAB — CBC
HCT: 42 % (ref 39.0–52.0)
Hemoglobin: 14.3 g/dL (ref 13.0–17.0)
MCH: 28.5 pg (ref 26.0–34.0)
MCHC: 34 g/dL (ref 30.0–36.0)
MCV: 83.7 fL (ref 80.0–100.0)
Platelets: 275 K/uL (ref 150–400)
RBC: 5.02 MIL/uL (ref 4.22–5.81)
RDW: 12.5 % (ref 11.5–15.5)
WBC: 6.9 K/uL (ref 4.0–10.5)
nRBC: 0 % (ref 0.0–0.2)

## 2024-03-09 LAB — BASIC METABOLIC PANEL WITH GFR
Anion gap: 7 (ref 5–15)
BUN: 12 mg/dL (ref 6–20)
CO2: 28 mmol/L (ref 22–32)
Calcium: 10 mg/dL (ref 8.9–10.3)
Chloride: 100 mmol/L (ref 98–111)
Creatinine, Ser: 0.89 mg/dL (ref 0.61–1.24)
GFR, Estimated: >60 mL/min (ref >60)
Glucose, Bld: 93 mg/dL (ref 70–99)
Potassium: 3.7 mmol/L (ref 3.5–5.1)
Sodium: 136 mmol/L (ref 135–145)

## 2024-03-09 LAB — TROPONIN T, HIGH SENSITIVITY: Troponin T High Sensitivity: <15 ng/L (ref 0–19)

## 2024-03-09 NOTE — ED Triage Notes (Signed)
 Pt POV reporting L arm pain,chest pain and fatigue x1 day. Took BP at [facility] 130 sys.

## 2024-03-10 ENCOUNTER — Emergency Department (HOSPITAL_BASED_OUTPATIENT_CLINIC_OR_DEPARTMENT_OTHER)
Admission: EM | Admit: 2024-03-10 | Discharge: 2024-03-10 | Disposition: A | Discharge status: Home / Self Care | Attending: Emergency Medicine | Admitting: Emergency Medicine

## 2024-03-10 DIAGNOSIS — M25512 Pain in left shoulder: Secondary | ICD-10-CM

## 2024-03-10 NOTE — ED Provider Notes (Signed)
 [place] EMERGENCY DEPARTMENT AT [facility] Provider Note   CSN: [id number] Arrival date & time: 03/09/24  2252     Patient presents with: Chest Pain   Dave Munoz is a 23 y.o. male.   The history is provided by the patient.  Chest Pain Pain location:  L lateral chest Pain quality: aching   Pain radiates to:  Does not radiate Pain severity:  Moderate Onset quality:  Gradual Timing:  Constant Progression:  Unchanged Chronicity:  New Context: at rest   Relieved by:  Nothing Worsened by:  Nothing Ineffective treatments:  None tried Associated symptoms: no shortness of breath, no syncope, no vomiting and no weakness   Risk factors: no high cholesterol   Left chest and shoulder pain waxing and waning for several days.  Worse in 1 days.  No travel.  No leg pain no exertional symptoms.       Prior to Admission medications   Medication Sig Start Date End Date Taking? Authorizing Provider  acetaminophen (TYLENOL) 500 MG tablet Take 1,000 mg by mouth every 6 (six) hours as needed for mild pain.    [provider]  albuterol  (VENTOLIN  HFA) 108 (90 Base) MCG/ACT inhaler Inhale into the lungs every 6 (six) hours as needed.     [provider]  FLUoxetine  (PROZAC ) 20 MG capsule TAKE 1 CAPSULE(20 MG) BY MOUTH DAILY 09/04/21   Corey, Evan S, MD  hydrOXYzine  (ATARAX /VISTARIL ) 25 MG tablet Take 1 tablet (25 mg total) by mouth every 6 (six) hours. 12/14/19   Henderly, Britni A, PA-C  lidocaine  (XYLOCAINE ) 2 % solution Use as directed 15 mLs in the mouth or throat as needed for mouth pain. 06/25/20   Robinson, Jordan N, PA-C  Multiple Vitamin (MULTIVITAMIN WITH MINERALS) TABS tablet Take 1 tablet by mouth daily.    [provider]    Allergies: Ibuprofen    Review of Systems  Respiratory:  Negative for shortness of breath.   Cardiovascular:  Positive for chest pain. Negative for syncope.  Gastrointestinal:  Negative for vomiting.   Musculoskeletal:  Positive for arthralgias.  Neurological:  Negative for weakness.  All other systems reviewed and are negative.   Updated Vital Signs BP 127/77   Pulse 66   Temp 97.9 F (36.6 C)   Resp 18   Ht 6' 2 (1.88 m)   Wt 79.4 kg   SpO2 97%   BMI 22.47 kg/m   Physical Exam Vitals and nursing note reviewed.  Constitutional:      General: He is not in acute distress.    Appearance: Normal appearance. He is well-developed. He is not diaphoretic.  HENT:     Head: Normocephalic and atraumatic.     Nose: Nose normal.  Eyes:     Conjunctiva/sclera: Conjunctivae normal.     Pupils: Pupils are equal, round, and reactive to light.  Cardiovascular:     Rate and Rhythm: Normal rate and regular rhythm.     Pulses: Normal pulses.     Heart sounds: Normal heart sounds.  Pulmonary:     Effort: Pulmonary effort is normal.     Breath sounds: Normal breath sounds. No wheezing or rales.  Abdominal:     General: Bowel sounds are normal.     Palpations: Abdomen is soft.     Tenderness: There is no abdominal tenderness. There is no guarding or rebound.  Musculoskeletal:        General: Normal range of motion.     Cervical  back: Normal range of motion and neck supple.  Skin:    General: Skin is warm and dry.     Capillary Refill: Capillary refill takes less than 2 seconds.  Neurological:     General: No focal deficit present.     Mental Status: He is alert and oriented to person, place, and time.     Deep Tendon Reflexes: Reflexes normal.  Psychiatric:        Mood and Affect: Mood normal.     (all labs ordered are listed, but only abnormal results are displayed) Results for orders placed or performed during the hospital encounter of 03/10/24  Basic metabolic panel   Collection Time: 03/09/24 11:08 PM  Result Value Ref Range   Sodium 136 135 - 145 mmol/L   Potassium 3.7 3.5 - 5.1 mmol/L   Chloride 100 98 - 111 mmol/L   CO2 28 22 - 32 mmol/L   Glucose, Bld 93 70 - 99  mg/dL   BUN 12 6 - 20 mg/dL   Creatinine, Ser 9.10 0.61 - 1.24 mg/dL   Calcium 89.9 8.9 - 89.6 mg/dL   GFR, Estimated >39 >39 mL/min   Anion gap 7 5 - 15  CBC   Collection Time: 03/09/24 11:08 PM  Result Value Ref Range   WBC 6.9 4.0 - 10.5 K/uL   RBC 5.02 4.22 - 5.81 MIL/uL   Hemoglobin 14.3 13.0 - 17.0 g/dL   HCT 57.9 60.9 - 47.9 %   MCV 83.7 80.0 - 100.0 fL   MCH 28.5 26.0 - 34.0 pg   MCHC 34.0 30.0 - 36.0 g/dL   RDW 87.4 88.4 - 84.4 %   Platelets 275 150 - 400 K/uL   nRBC 0.0 0.0 - 0.2 %  Troponin T, High Sensitivity   Collection Time: 03/09/24 11:08 PM  Result Value Ref Range   Troponin T High Sensitivity <15 0 - 19 ng/L   DG Chest 2 View Result Date: 03/09/2024 EXAM: 2 VIEW(S) XRAY OF THE CHEST 03/09/2024 11:13:00 PM COMPARISON: Portable chest 12/14/2019. CLINICAL HISTORY: chest pain. Pt POV reporting L arm pain,chest pain and fatigue x1 day. Took BP at [facility] 130 sys. FINDINGS: LUNGS AND PLEURA: The lungs are hyperexpanded but clear. No focal pulmonary opacity. No pulmonary edema. No pleural effusion. No pneumothorax. HEART AND MEDIASTINUM: The heart size, vascular markings, and mediastinal configuration are normal. BONES AND SOFT TISSUES: No acute osseous abnormality. IMPRESSION: 1. No acute cardiopulmonary findings. Electronically signed by: Francis Quam MD 03/09/2024 11:15 PM EDT RP Workstation: [id number]    EKG: EKG Interpretation Date/Time:  Monday March 09 2024 23:02:42 EDT Ventricular Rate:  64 PR Interval:  150 QRS Duration:  106 QT Interval:  380 QTC Calculation: 392 R Axis:   89  Text Interpretation: Normal sinus rhythm Confirmed by Nettie, Devona Holmes ([id number]) on 03/10/2024 3:42:47 AM  Radiology: [facility] Chest 2 View Result Date: 03/09/2024 EXAM: 2 VIEW(S) XRAY OF THE CHEST 03/09/2024 11:13:00 PM COMPARISON: Portable chest 12/14/2019. CLINICAL HISTORY: chest pain. Pt POV reporting L arm pain,chest pain and fatigue x1 day. Took BP at [facility] 130 sys. FINDINGS:  LUNGS AND PLEURA: The lungs are hyperexpanded but clear. No focal pulmonary opacity. No pulmonary edema. No pleural effusion. No pneumothorax. HEART AND MEDIASTINUM: The heart size, vascular markings, and mediastinal configuration are normal. BONES AND SOFT TISSUES: No acute osseous abnormality. IMPRESSION: 1. No acute cardiopulmonary findings. Electronically signed by: Francis Quam MD 03/09/2024 11:15 PM EDT RP Workstation: [id number]     Procedures  Medications Ordered in the ED - No data to display                                  Medical Decision Making Patient with left chest pain and shoulder pain deep in the joint   Amount and/or Complexity of Data Reviewed External Data Reviewed: notes.    Details: Previous notes reviewed  Labs: ordered.    Details: Negative troponin.  Normal white count 6.9, normal hemoglobin 14.3 normal platelets  Radiology: ordered and independent interpretation performed.    Details: Normal  ECG/medicine tests: ordered and independent interpretation performed. Decision-making details documented in ED Course.  Risk Risk Details: This is not cardiac heart score is 1 very low risk for MACE. PERC negative wells 0 highly doubt PE in this low risk patient.  Favor MSk pain.  Stable for discharge.       Final diagnoses:  Acute pain of left shoulder    No signs of systemic illness or infection. The patient is nontoxic-appearing on exam and vital signs are within normal limits.  I have reviewed the triage vital signs and the nursing notes. Pertinent labs & imaging results that were available during my care of the patient were reviewed by me and considered in my medical decision making (see chart for details). After history, exam, and medical workup I feel the patient has been appropriately medically screened and is safe for discharge home. Pertinent diagnoses were discussed with the patient. Patient was given return precautions.  ED Discharge Orders     None           Albie Bazin, MD 03/10/24 [phone]

## 2024-05-31 ENCOUNTER — Emergency Department (HOSPITAL_BASED_OUTPATIENT_CLINIC_OR_DEPARTMENT_OTHER)
Admission: EM | Admit: 2024-05-31 | Discharge: 2024-06-01 | Disposition: A | Attending: Emergency Medicine | Admitting: Emergency Medicine

## 2024-05-31 ENCOUNTER — Emergency Department (HOSPITAL_BASED_OUTPATIENT_CLINIC_OR_DEPARTMENT_OTHER)

## 2024-05-31 ENCOUNTER — Other Ambulatory Visit: Payer: Self-pay

## 2024-05-31 ENCOUNTER — Encounter (HOSPITAL_BASED_OUTPATIENT_CLINIC_OR_DEPARTMENT_OTHER): Payer: Self-pay

## 2024-05-31 DIAGNOSIS — J45909 Unspecified asthma, uncomplicated: Secondary | ICD-10-CM | POA: Diagnosis not present

## 2024-05-31 DIAGNOSIS — R072 Precordial pain: Secondary | ICD-10-CM | POA: Insufficient documentation

## 2024-05-31 LAB — BASIC METABOLIC PANEL WITH GFR
Anion gap: 11 (ref 5–15)
BUN: 13 mg/dL (ref 6–20)
CO2: 27 mmol/L (ref 22–32)
Calcium: 9.4 mg/dL (ref 8.9–10.3)
Chloride: 103 mmol/L (ref 98–111)
Creatinine, Ser: 0.82 mg/dL (ref 0.61–1.24)
GFR, Estimated: >60 mL/min
Glucose, Bld: 99 mg/dL (ref 70–99)
Potassium: 3.6 mmol/L (ref 3.5–5.1)
Sodium: 141 mmol/L (ref 135–145)

## 2024-05-31 LAB — CBC WITH DIFFERENTIAL/PLATELET
Abs Immature Granulocytes: 0.02 K/uL (ref 0.00–0.07)
Basophils Absolute: 0 K/uL (ref 0.0–0.1)
Basophils Relative: 1 %
Eosinophils Absolute: 0.5 K/uL (ref 0.0–0.5)
Eosinophils Relative: 7 %
HCT: 42.3 % (ref 39.0–52.0)
Hemoglobin: 14.4 g/dL (ref 13.0–17.0)
Immature Granulocytes: 0 %
Lymphocytes Relative: 32 %
Lymphs Abs: 2.4 K/uL (ref 0.7–4.0)
MCH: 28.5 pg (ref 26.0–34.0)
MCHC: 34 g/dL (ref 30.0–36.0)
MCV: 83.6 fL (ref 80.0–100.0)
Monocytes Absolute: 0.6 K/uL (ref 0.1–1.0)
Monocytes Relative: 8 %
Neutro Abs: 4 K/uL (ref 1.7–7.7)
Neutrophils Relative %: 52 %
Platelets: 271 K/uL (ref 150–400)
RBC: 5.06 MIL/uL (ref 4.22–5.81)
RDW: 12.3 % (ref 11.5–15.5)
WBC: 7.6 K/uL (ref 4.0–10.5)
nRBC: 0 % (ref 0.0–0.2)

## 2024-05-31 LAB — TROPONIN T, HIGH SENSITIVITY: Troponin T High Sensitivity: <15 ng/L (ref 0–19)

## 2024-05-31 NOTE — ED Triage Notes (Signed)
 Pt states intermittent  CP started yesterday, radiates to left shoulder.   Denies N/V or diaphoresis Pain today has been more constant 7/10 aching pain

## 2024-05-31 NOTE — ED Provider Notes (Signed)
 "  Emergency Department Provider Note   I have reviewed the triage vital signs and the nursing notes.   HISTORY  Chief Complaint Chest Pain   HPI Dave Munoz is a 24 y.o. male with past history reviewed below presents to the emergency department with point, pressure-like sensation to the left chest.  Patient has had fairly constant, aching pain in the left chest since yesterday.  Some slight radiation of pain symptoms upward but no diaphoresis or nausea.  No pleuritic pain.  Denies shortness of breath.  He has had some more chronic pain in the left arm and occasional pain to the left face but has not had similar symptoms with the chest discomfort.  He does not do heavy lifting or recent change in exercise. No family history of heart issues.   Past Medical History:  Diagnosis Date   Abnormal EKG    Allergic rhinitis    Anxiety    Asthma    Postconcussion syndrome     Review of Systems  Constitutional: No fever/chills Cardiovascular: Positive chest pain. Respiratory: Denies shortness of breath. Gastrointestinal: No abdominal pain.  No nausea, no vomiting.   ____________________________________________   PHYSICAL EXAM:  VITAL SIGNS: ED Triage Vitals  Encounter Vitals Group     BP 05/31/24 2245 (!) 140/84     Pulse Rate 05/31/24 2245 82     Resp 05/31/24 2245 18     Temp 05/31/24 2245 97.7 F (36.5 C)     Temp Source 05/31/24 2245 Oral     SpO2 05/31/24 2245 99 %     Weight 05/31/24 2247 175 lb (79.4 kg)     Height 05/31/24 2247 6' 2 (1.88 m)   Constitutional: Alert and oriented. Well appearing and in no acute distress. Eyes: Conjunctivae are normal.  Head: Atraumatic. Nose: No congestion/rhinnorhea. Mouth/Throat: Mucous membranes are moist.   Neck: No stridor.   Cardiovascular: Normal rate, regular rhythm. Good peripheral circulation. Grossly normal heart sounds.   Respiratory: Normal respiratory effort.  No retractions. Lungs CTAB. Gastrointestinal: Soft  and nontender. No distention.  Musculoskeletal: No gross deformities of extremities. Mild tenderness to palpation of the left anterior chest wall.  Neurologic:  Normal speech and language. Skin:  Skin is warm, dry and intact. No rash noted.  ____________________________________________   LABS (all labs ordered are listed, but only abnormal results are displayed)  Labs Reviewed  HEPATIC FUNCTION PANEL - Abnormal; Notable for the following components:      Result Value   Indirect Bilirubin 0.2 (*)    All other components within normal limits  CBC WITH DIFFERENTIAL/PLATELET  BASIC METABOLIC PANEL WITH GFR  LIPASE, BLOOD  TROPONIN T, HIGH SENSITIVITY   ____________________________________________  EKG   EKG Interpretation Date/Time:  Sunday May 31 2024 22:48:29 EST Ventricular Rate:  78 PR Interval:  175 QRS Duration:  79 QT Interval:  364 QTC Calculation: 415 R Axis:   84  Text Interpretation: Sinus rhythm Anteroseptal infarct, old Borderline ST elevation, inferior leads Similar to prior Confirmed by Darra Chew [phone]) on 05/31/2024 11:01:01 PM        ____________________________________________  RADIOLOGY  DG Chest 2 View Result Date: 05/31/2024 EXAM: 2 VIEW(S) XRAY OF THE CHEST 05/31/2024 11:11:00 PM COMPARISON: 03/09/2024. CLINICAL HISTORY: cp cp FINDINGS: LUNGS AND PLEURA: No focal pulmonary opacity. No pleural effusion. No pneumothorax. HEART AND MEDIASTINUM: No acute abnormality of the cardiac and mediastinal silhouettes. BONES AND SOFT TISSUES: No acute osseous abnormality. IMPRESSION: 1. No active cardiopulmonary disease.  Electronically signed by: Franky Crease MD 05/31/2024 11:15 PM EST RP Workstation: [id number]    ____________________________________________   PROCEDURES  Procedure(s) performed:   Procedures  None  ____________________________________________   INITIAL IMPRESSION / ASSESSMENT AND PLAN / ED COURSE  Pertinent labs & imaging  results that were available during my care of the patient were reviewed by me and considered in my medical decision making (see chart for details).   This patient is Presenting for Evaluation of CP, which does require a range of treatment options, and is a complaint that involves a high risk of morbidity and mortality.  The Differential Diagnoses includes but is not exclusive to acute coronary syndrome, aortic dissection, pulmonary embolism, cardiac tamponade, community-acquired pneumonia, pericarditis, musculoskeletal chest wall pain, etc.  Clinical Laboratory Tests Ordered, included troponin negative. Normal LFTs. Lipase negative. CBC without leukocytosis.   Radiologic Tests Ordered, included CXR. I independently interpreted the images and agree with radiology interpretation.   Cardiac Monitor Tracing which shows NSR.    Social Determinants of Health Risk patient is a non-smoker.   Medical Decision Making: Summary:  Patient presents to the emergency department with constant chest pain since yesterday.  Atypical for PE.  Patient is low risk by [place].  PERC negative.  Chest x-ray clear.  Plan for single troponin but clinically suspect musculoskeletal chest pain as the cause.  Reevaluation with update and discussion with patient. Labs and imaging reassuring. Doubt cardiac related CP. Plan for mgmt of symptoms at home with strict ED return precautions.   Patient's presentation is most consistent with acute presentation with potential threat to life or bodily function.   Disposition: discharge  ____________________________________________  FINAL CLINICAL IMPRESSION(S) / ED DIAGNOSES  Final diagnoses:  Precordial chest pain   Note:  This document was prepared using Dragon voice recognition software and may include unintentional dictation errors.  Fonda Law, MD, [facility] Emergency Medicine    Menaal Russum, Fonda MATSU, MD 06/01/24 [phone]  "

## 2024-06-01 LAB — HEPATIC FUNCTION PANEL
ALT: 17 U/L (ref 0–44)
AST: 33 U/L (ref 15–41)
Albumin: 4.6 g/dL (ref 3.5–5.0)
Alkaline Phosphatase: 54 U/L (ref 38–126)
Bilirubin, Direct: 0.1 mg/dL (ref 0.0–0.2)
Indirect Bilirubin: 0.2 mg/dL — ABNORMAL LOW (ref 0.3–0.9)
Total Bilirubin: 0.4 mg/dL (ref 0.0–1.2)
Total Protein: 7.2 g/dL (ref 6.5–8.1)

## 2024-06-01 LAB — LIPASE, BLOOD: Lipase: 44 U/L (ref 11–51)

## 2024-06-01 NOTE — ED Notes (Signed)
 RN provided AVS using Teachback Method. Patient verbalizes understanding of Discharge Instructions. Opportunity for Questioning and Answers were provided by RN. Patient Discharged from ED ambulatory to home via self.

## 2024-06-01 NOTE — Discharge Instructions (Signed)
